# Patient Record
Sex: Male | Born: 2000 | Race: White | Hispanic: No | Smoking: Never smoker
Health system: Southern US, Community
[De-identification: ages and names within clinical notes are randomized; demographics above are authoritative.]

## PROBLEM LIST (undated history)

## (undated) ENCOUNTER — Emergency Department: Admission: EM | Disposition: A | Discharge status: Home / Self Care | Payer: Self-pay | Source: Home / Self Care

## (undated) DIAGNOSIS — J45909 Unspecified asthma, uncomplicated: Secondary | ICD-10-CM

---

## 2019-05-20 ENCOUNTER — Other Ambulatory Visit: Payer: Self-pay

## 2019-05-20 DIAGNOSIS — Z20822 Contact with and (suspected) exposure to covid-19: Secondary | ICD-10-CM

## 2019-05-22 LAB — NOVEL CORONAVIRUS, NAA: SARS-CoV-2, NAA: NOT DETECTED

## 2020-02-28 ENCOUNTER — Encounter: Payer: Self-pay | Admitting: Emergency Medicine

## 2020-02-28 ENCOUNTER — Ambulatory Visit
Admission: EM | Admit: 2020-02-28 | Discharge: 2020-02-28 | Disposition: A | Discharge status: Home / Self Care | Payer: 59 | Attending: Family Medicine | Admitting: Family Medicine

## 2020-02-28 ENCOUNTER — Ambulatory Visit (INDEPENDENT_AMBULATORY_CARE_PROVIDER_SITE_OTHER): Payer: 59

## 2020-02-28 ENCOUNTER — Other Ambulatory Visit: Payer: Self-pay

## 2020-02-28 DIAGNOSIS — Z8709 Personal history of other diseases of the respiratory system: Secondary | ICD-10-CM | POA: Diagnosis present

## 2020-02-28 DIAGNOSIS — R0789 Other chest pain: Secondary | ICD-10-CM | POA: Insufficient documentation

## 2020-02-28 DIAGNOSIS — M940 Chondrocostal junction syndrome [Tietze]: Secondary | ICD-10-CM | POA: Diagnosis not present

## 2020-02-28 HISTORY — DX: Unspecified asthma, uncomplicated: J45.909

## 2020-02-28 LAB — TROPONIN I (HIGH SENSITIVITY): Troponin I (High Sensitivity): 4 ng/L (ref <18)

## 2020-02-28 MED ORDER — ALBUTEROL SULFATE HFA 108 (90 BASE) MCG/ACT IN AERS: 1.0000 | INHALATION_SPRAY | Freq: Four times a day (QID) | RESPIRATORY_TRACT | 0 refills | Status: DC | PRN

## 2020-02-28 NOTE — ED Provider Notes (Signed)
[facility]    CSN: [id number] Arrival date & time: 02/28/20  0818      History   Chief Complaint Chief Complaint  Patient presents with  . Chest Pain    HPI Gabriel Gibson is a 19 y.o. male.   19 yo male with a c/o bilateral lower rib pains and tightness in his chest for the past 5 days (since Monday). Denies any injuries, cough, fevers, chills, shortness of breath, wheezing, rash, swelling or other symptoms. Has not had the covid vaccine. States as a child he had asthma and he does have seasonal allergies.  States that he vapes but denies any other drug use. No family h/o premature cardiac disease.    Chest Pain   Past Medical History:  Diagnosis Date  . Asthma     There are no problems to display for this patient.   History reviewed. No pertinent surgical history.     Home Medications    Prior to Admission medications   Medication Sig Start Date End Date Taking? Authorizing Provider  albuterol (VENTOLIN HFA) 108 (90 Base) MCG/ACT inhaler Inhale 1-2 puffs into the lungs every 6 (six) hours as needed for wheezing or shortness of breath. 02/28/20   Payton Mccallum, MD    Family History Family History  Problem Relation Age of Onset  . Healthy Mother     Social History Social History   Tobacco Use  . Smoking status: Never Smoker  . Smokeless tobacco: Never Used  Vaping Use  . Vaping Use: Every day  Substance Use Topics  . Alcohol use: Yes  . Drug use: Never     Allergies   Patient has no known allergies.   Review of Systems Review of Systems  Cardiovascular: Positive for chest pain.     Physical Exam Triage Vital Signs ED Triage Vitals  Enc Vitals Group     BP 02/28/20 0833 (!) 146/78     Pulse Rate 02/28/20 0833 74     Resp 02/28/20 0833 14     Temp 02/28/20 0833 98.2 F (36.8 C)     Temp Source 02/28/20 0833 Oral     SpO2 02/28/20 0833 100 %     Weight 02/28/20 0831 178 lb (80.7 kg)     Height 02/28/20 0831 5\' 11"  (1.803 m)       Head Circumference --      Peak Flow --      Pain Score 02/28/20 0831 5     Pain Loc --      Pain Edu? --      Excl. in GC? --    No data found.  Updated Vital Signs BP 122/80 (BP Location: Right Arm)   Pulse 74   Temp 98.2 F (36.8 C) (Oral)   Resp 14   Ht 5\' 11"  (1.803 m)   Wt 80.7 kg   SpO2 100%   BMI 24.83 kg/m   Visual Acuity Right Eye Distance:   Left Eye Distance:   Bilateral Distance:    Right Eye Near:   Left Eye Near:    Bilateral Near:     Physical Exam Vitals and nursing note reviewed.  Constitutional:      General: He is not in acute distress.    Appearance: He is not toxic-appearing or diaphoretic.  Cardiovascular:     Rate and Rhythm: Normal rate and regular rhythm.     Heart sounds: Normal heart sounds.  Pulmonary:     Effort: Pulmonary  effort is normal. No respiratory distress.     Breath sounds: Normal breath sounds. No stridor. No wheezing, rhonchi or rales.  Chest:     Chest wall: Tenderness (mild) present.  Neurological:     Mental Status: He is alert.      UC Treatments / Results  Labs (all labs ordered are listed, but only abnormal results are displayed) Labs Reviewed  TROPONIN I (HIGH SENSITIVITY)    EKG   Radiology DG Chest 2 View  Result Date: 02/28/2020 CLINICAL DATA:  Chest pain EXAM: CHEST - 2 VIEW COMPARISON:  None. FINDINGS: Normal heart size. Normal mediastinal contour. No pneumothorax. No pleural effusion. Lungs appear clear, with no acute consolidative airspace disease and no pulmonary edema. IMPRESSION: No active cardiopulmonary disease. Electronically Signed   By: Ilona Sorrel M.D.   On: 02/28/2020 09:23    Procedures ED EKG  Date/Time: 02/28/2020 9:47 AM Performed by: Norval Gable, MD Authorized by: Norval Gable, MD   ECG reviewed by ED Physician in the absence of a cardiologist: yes   Previous ECG:    Previous ECG:  Unavailable Interpretation:    Interpretation: normal   Rate:    ECG rate:   82   ECG rate assessment: normal   Rhythm:    Rhythm: sinus rhythm   Ectopy:    Ectopy: none   QRS:    QRS axis:  Normal Conduction:    Conduction: normal   ST segments:    ST segments:  Normal T waves:    T waves: normal     (including critical care time)  Medications Ordered in UC Medications - No data to display  Initial Impression / Assessment and Plan / UC Course  I have reviewed the triage vital signs and the nursing notes.  Pertinent labs & imaging results that were available during my care of the patient were reviewed by me and considered in my medical decision making (see chart for details).      Final Clinical Impressions(s) / UC Diagnoses   Final diagnoses:  Atypical chest pain  H/O intrinsic asthma  Costochondritis     Discharge Instructions     Tylenol, advil as needed    ED Prescriptions    Medication Sig Dispense Auth. Provider   albuterol (VENTOLIN HFA) 108 (90 Base) MCG/ACT inhaler Inhale 1-2 puffs into the lungs every 6 (six) hours as needed for wheezing or shortness of breath. 8 g Norval Gable, MD      1. Labs/x-ray/ekg results and diagnosis reviewed with patient and parent 2. rx as per orders above; reviewed possible side effects, interactions, risks and benefits  3. Recommend supportive treatment with as above 4. Follow up with PCP 5. Follow-up prn if symptoms worsen or don't improve  PDMP not reviewed this encounter.   Norval Gable, MD 02/28/20 [phone]

## 2020-02-28 NOTE — Discharge Instructions (Addendum)
Tylenol, advil as needed

## 2020-02-28 NOTE — ED Triage Notes (Signed)
Patient c/o bilateral rib pain and tightness in his chest that started on Monday.  Patient denies cough or SOB.  Patient denies any injury or fall.  Patient states that he has not had a COVID vaccine.

## 2020-05-11 ENCOUNTER — Other Ambulatory Visit: Payer: Self-pay

## 2020-05-11 ENCOUNTER — Ambulatory Visit
Admission: EM | Admit: 2020-05-11 | Discharge: 2020-05-11 | Disposition: A | Discharge status: Home / Self Care | Payer: 59 | Attending: Internal Medicine | Admitting: Internal Medicine

## 2020-05-11 DIAGNOSIS — R300 Dysuria: Secondary | ICD-10-CM | POA: Insufficient documentation

## 2020-05-11 DIAGNOSIS — N390 Urinary tract infection, site not specified: Secondary | ICD-10-CM | POA: Insufficient documentation

## 2020-05-11 LAB — CHLAMYDIA/NGC RT PCR (ARMC ONLY)
Chlamydia Tr: NOT DETECTED
N gonorrhoeae: NOT DETECTED

## 2020-05-11 LAB — URINALYSIS, COMPLETE (UACMP) WITH MICROSCOPIC
Bilirubin Urine: NEGATIVE
Glucose, UA: NEGATIVE mg/dL
Hgb urine dipstick: NEGATIVE
Ketones, ur: NEGATIVE mg/dL
Leukocytes,Ua: NEGATIVE
Nitrite: NEGATIVE
Protein, ur: NEGATIVE mg/dL
RBC / HPF: NONE SEEN RBC/hpf (ref 0–5)
Specific Gravity, Urine: 1.01 (ref 1.005–1.030)
pH: 7 (ref 5.0–8.0)

## 2020-05-11 MED ORDER — SULFAMETHOXAZOLE-TRIMETHOPRIM 800-160 MG PO TABS: 1.0000 | ORAL_TABLET | Freq: Two times a day (BID) | ORAL | 0 refills | Status: DC

## 2020-05-11 NOTE — ED Triage Notes (Signed)
Patient in today w/ c/o urinary frequency and burning. Patient states sx onset was approx. 3 wks ago. Patient has not taken anything for this.

## 2020-05-11 NOTE — Discharge Instructions (Addendum)

## 2020-05-11 NOTE — ED Provider Notes (Signed)
[facility]    CSN: [id number] Arrival date & time: 05/11/20  1847      History   Chief Complaint Chief Complaint  Patient presents with  . Urinary Tract Infection    HPI Gabriel Gibson is a 19 y.o. male.   19 y/o male presents for pain at the end of urination x 3 weeks. Admits to urgency of urination. Denies hematuria, fever, fatigue, chills, back pain, n/v/d, urethral discharge, testicular pain. Patient has not been sexually active in 2 years and is not concerned for STIs. Denies skin sores or skin color changes. No other concerns today.     Past Medical History:  Diagnosis Date  . Asthma     There are no problems to display for this patient.   History reviewed. No pertinent surgical history.     Home Medications    Prior to Admission medications   Medication Sig Start Date End Date Taking? Authorizing Provider  albuterol (VENTOLIN HFA) 108 (90 Base) MCG/ACT inhaler Inhale 1-2 puffs into the lungs every 6 (six) hours as needed for wheezing or shortness of breath. 02/28/20  Yes Payton Mccallum, MD  sulfamethoxazole-trimethoprim (BACTRIM DS) 800-160 MG tablet Take 1 tablet by mouth 2 (two) times daily for 7 days. 05/11/20 05/18/20  Shirlee Latch, PA-C    Family History Family History  Problem Relation Age of Onset  . Healthy Mother     Social History Social History   Tobacco Use  . Smoking status: Never Smoker  . Smokeless tobacco: Never Used  Vaping Use  . Vaping Use: Every day  Substance Use Topics  . Alcohol use: Yes  . Drug use: Never     Allergies   Patient has no known allergies.   Review of Systems Review of Systems  Constitutional: Negative for fatigue and fever.  Gastrointestinal: Negative for abdominal pain, nausea and vomiting.  Genitourinary: Positive for difficulty urinating and dysuria. Negative for discharge, frequency, genital sores, hematuria, penile pain and testicular pain.  Musculoskeletal: Negative for back pain.    Skin: Negative for rash.     Physical Exam Triage Vital Signs ED Triage Vitals  Enc Vitals Group     BP 05/11/20 1915 (!) 142/75     Pulse Rate 05/11/20 1915 75     Resp 05/11/20 1915 18     Temp 05/11/20 1915 98.7 F (37.1 C)     Temp Source 05/11/20 1915 Oral     SpO2 05/11/20 1915 100 %     Weight 05/11/20 1917 178 lb (80.7 kg)     Height 05/11/20 1917 5\' 11"  (1.803 m)     Head Circumference --      Peak Flow --      Pain Score 05/11/20 1917 4     Pain Loc --      Pain Edu? --      Excl. in GC? --    No data found.  Updated Vital Signs BP (!) 142/75 (BP Location: Left Arm)   Pulse 75   Temp 98.7 F (37.1 C) (Oral)   Resp 18   Ht 5\' 11"  (1.803 m)   Wt 178 lb (80.7 kg)   SpO2 100%   BMI 24.83 kg/m    Physical Exam Vitals and nursing note reviewed.  Constitutional:      Appearance: He is well-developed.  HENT:     Head: Normocephalic and atraumatic.  Eyes:     General: No scleral icterus.    Conjunctiva/sclera: Conjunctivae  normal.  Cardiovascular:     Rate and Rhythm: Normal rate and regular rhythm.     Heart sounds: Normal heart sounds. No murmur heard.   Pulmonary:     Effort: Pulmonary effort is normal. No respiratory distress.     Breath sounds: Normal breath sounds.  Abdominal:     Palpations: Abdomen is soft.     Tenderness: There is no abdominal tenderness. There is no right CVA tenderness or left CVA tenderness.  Musculoskeletal:     Cervical back: Neck supple.  Skin:    General: Skin is warm and dry.  Neurological:     Mental Status: He is alert.  Psychiatric:        Mood and Affect: Mood normal.        Behavior: Behavior normal.        Thought Content: Thought content normal.      UC Treatments / Results  Labs (all labs ordered are listed, but only abnormal results are displayed) Labs Reviewed  URINALYSIS, COMPLETE (UACMP) WITH MICROSCOPIC - Abnormal; Notable for the following components:      Result Value   Bacteria, UA FEW  (*)    All other components within normal limits  CHLAMYDIA/NGC RT PCR ([facility] ONLY)  URINE CULTURE    EKG   Radiology No results found.  Procedures Procedures (including critical care time)  Medications Ordered in UC Medications - No data to display  Initial Impression / Assessment and Plan / UC Course  I have reviewed the triage vital signs and the nursing notes.  Pertinent labs & imaging results that were available during my care of the patient were reviewed by me and considered in my medical decision making (see chart for details).    Patient with pain at end of urination x 3 weeks has few bacterial on UA. Sending culture and treating with antibiotics for possible UTI at this time. Denies STI concerns since not sexually active but agrees to testing. Deferred genital exam at this time--he denies testicular pain, skin lesions, etc. Advised following up with urologist if negative urine culture/STI testing and still symptomatic.   Final Clinical Impressions(s) / UC Diagnoses   Final diagnoses:  Dysuria  Lower urinary tract infectious disease     Discharge Instructions     UTI: Based on either symptoms or urinalysis, you may have a urinary tract infection. We will send the urine for culture and call with results in a few days. Begin antibiotics at this time. Your symptoms should be much improved over the next 2-3 days. Increase rest and fluid intake. If for some reason symptoms are worsening or not improving after a couple of days or the urine culture determines the antibiotics you are taking will not treat the infection, the antibiotics may be changed. Return or go to ER for fever, back pain, worsening urinary pain, discharge, increased blood in urine. May take Tylenol or Motrin OTC for pain relief or consider AZO if no contraindications     ED Prescriptions    Medication Sig Dispense Auth. Provider   sulfamethoxazole-trimethoprim (BACTRIM DS) 800-160 MG tablet Take 1  tablet by mouth 2 (two) times daily for 7 days. 14 tablet Gareth Morgan     PDMP not reviewed this encounter.   Shirlee Latch, PA-C 05/12/20 1616

## 2020-05-12 LAB — URINE CULTURE: Culture: <10000 — AB

## 2020-05-14 ENCOUNTER — Other Ambulatory Visit: Payer: Self-pay

## 2020-05-14 ENCOUNTER — Encounter: Payer: Self-pay | Admitting: Emergency Medicine

## 2020-05-14 ENCOUNTER — Ambulatory Visit
Admission: EM | Admit: 2020-05-14 | Discharge: 2020-05-14 | Disposition: A | Discharge status: Home / Self Care | Payer: 59 | Attending: Family Medicine | Admitting: Family Medicine

## 2020-05-14 DIAGNOSIS — R300 Dysuria: Secondary | ICD-10-CM | POA: Insufficient documentation

## 2020-05-14 LAB — URINALYSIS, COMPLETE (UACMP) WITH MICROSCOPIC
Bacteria, UA: NONE SEEN
Bilirubin Urine: NEGATIVE
Glucose, UA: NEGATIVE mg/dL
Hgb urine dipstick: NEGATIVE
Ketones, ur: NEGATIVE mg/dL
Leukocytes,Ua: NEGATIVE
Nitrite: NEGATIVE
Protein, ur: NEGATIVE mg/dL
RBC / HPF: NONE SEEN RBC/hpf (ref 0–5)
Specific Gravity, Urine: 1.01 (ref 1.005–1.030)
WBC, UA: NONE SEEN WBC/hpf (ref 0–5)
pH: 7 (ref 5.0–8.0)

## 2020-05-14 NOTE — ED Triage Notes (Signed)
Patient in today c/o 3.5 week history of burning with urination. Patient seen 05/11/20 for same at [facility]. Patient given antibiotics, but then advised to stop taking them because urine culture was negative. Patient had GC/Chlamydia testing which were negative. Patient states that he now has a discoloration on his penis x 2-3 days.

## 2020-05-14 NOTE — Discharge Instructions (Signed)
No evidence of UTI or infection.  I have placed a referral to urology.  Take care   Dr. Adriana Simas

## 2020-05-15 NOTE — ED Provider Notes (Signed)
[facility]    CSN: [id number] Arrival date & time: 05/14/20  1902   History   Chief Complaint Chief Complaint  Patient presents with  . Dysuria  . discoloration on penis   HPI  19 year old male presents with the above complaints.   Patient recently seen on 8/23 for dysuria. STD testing negative as was urine culture.   Patient reports that he continues to have dysuria. Has had dysuria for the past 3.5 weeks. No fever. No abdominal pain. No relieving factors. Is now concerned that he has discoloration of his genitals. No other associated symptoms. No other complaints.  Past Medical History:  Diagnosis Date  . Asthma    Home Medications    Prior to Admission medications   Medication Sig Start Date End Date Taking? Authorizing Provider  albuterol (VENTOLIN HFA) 108 (90 Base) MCG/ACT inhaler Inhale 1-2 puffs into the lungs every 6 (six) hours as needed for wheezing or shortness of breath. 02/28/20  Yes Payton Mccallum, MD    Family History Family History  Problem Relation Age of Onset  . Other Mother        unknown medical history  . Healthy Father     Social History Social History   Tobacco Use  . Smoking status: Never Smoker  . Smokeless tobacco: Never Used  Vaping Use  . Vaping Use: Every day  . Substances: Nicotine, Flavoring  Substance Use Topics  . Alcohol use: Yes    Comment: occassional  . Drug use: Never     Allergies   Patient has no known allergies.   Review of Systems Review of Systems  Constitutional: Negative.   Genitourinary: Positive for dysuria.   Physical Exam Triage Vital Signs ED Triage Vitals [05/14/20 2005]  Enc Vitals Group     BP (!) 148/89     Pulse Rate 81     Resp 18     Temp 98.7 F (37.1 C)     Temp Source Oral     SpO2 100 %     Weight 178 lb (80.7 kg)     Height 5\' 11"  (1.803 m)     Head Circumference      Peak Flow      Pain Score 4     Pain Loc      Pain Edu?      Excl. in GC?    Updated Vital  Signs BP (!) 148/89 (BP Location: Left Arm)   Pulse 81   Temp 98.7 F (37.1 C) (Oral)   Resp 18   Ht 5\' 11"  (1.803 m)   Wt 80.7 kg   SpO2 100%   BMI 24.83 kg/m   Visual Acuity Right Eye Distance:   Left Eye Distance:   Bilateral Distance:    Right Eye Near:   Left Eye Near:    Bilateral Near:     Physical Exam Constitutional:      General: He is not in acute distress.    Appearance: Normal appearance. He is not ill-appearing.  HENT:     Head: Normocephalic and atraumatic.  Pulmonary:     Effort: Pulmonary effort is normal. No respiratory distress.  Genitourinary:    Penis: Normal.      Testes: Normal.  Neurological:     Mental Status: He is alert.  Psychiatric:        Mood and Affect: Mood normal.        Behavior: Behavior normal.     UC  Treatments / Results  Labs (all labs ordered are listed, but only abnormal results are displayed) Labs Reviewed  URINALYSIS, COMPLETE (UACMP) WITH MICROSCOPIC    EKG   Radiology No results found.  Procedures Procedures (including critical care time)  Medications Ordered in UC Medications - No data to display  Initial Impression / Assessment and Plan / UC Course  I have reviewed the triage vital signs and the nursing notes.  Pertinent labs & imaging results that were available during my care of the patient were reviewed by me and considered in my medical decision making (see chart for details).    19 year old male presents with dysuria. Normal urine today. Recent negative culture and negative. STD testing. Normal GU exam. Unclear etiology at this time. Referring to urology.    Final Clinical Impressions(s) / UC Diagnoses   Final diagnoses:  Dysuria     Discharge Instructions     No evidence of UTI or infection.  I have placed a referral to urology.  Take care   Dr. Adriana Simas   ED Prescriptions    None     PDMP not reviewed this encounter.   Tommie Sams, [place] 05/15/20 2224

## 2020-05-27 ENCOUNTER — Ambulatory Visit: Payer: 59 | Admitting: Urology

## 2020-05-27 ENCOUNTER — Encounter: Payer: Self-pay | Admitting: Urology

## 2020-05-27 ENCOUNTER — Other Ambulatory Visit: Payer: Self-pay

## 2020-05-27 VITALS — BP 152/88 | HR 70 | Ht 71.0 in | Wt 178.0 lb

## 2020-05-27 DIAGNOSIS — R300 Dysuria: Secondary | ICD-10-CM | POA: Diagnosis not present

## 2020-05-27 DIAGNOSIS — N5082 Scrotal pain: Secondary | ICD-10-CM | POA: Diagnosis not present

## 2020-05-27 MED ORDER — ALFUZOSIN HCL ER 10 MG PO TB24: 10.0000 mg | ORAL_TABLET | Freq: Every day | ORAL | 0 refills | Status: DC

## 2020-05-27 NOTE — Progress Notes (Signed)
   05/27/2020 9:28 AM   Gabriel Gibson 09/17/2001 [id number]  Referring provider: Tommie Sams, DO [address] [place],  [place] [address]  Chief Complaint  Patient presents with  . Other    HPI: Gabriel Gibson is a 19 y.o. male seen at the request Everlene Other, DO for evaluation of dysuria.   Seen [facility] 05/11/2020 with a 3 week history of pain at the end of urination and urgency  Urinalysis unremarkable and urine culture negative; also underwent testing for chlamydia which was negative  Treated with an empiric course of Septra DS  Returned to Urgent Care 05/14/2020 complaining of persistent symptoms; urinalysis repeated and was clear and he was referred to urology  Also states he had bilateral scrotal pain during that same time period  Dysuria resolved last week and states his scrotal pain is much better  Denies gross hematuria  No prior history urologic problems   PMH: Past Medical History:  Diagnosis Date  . Asthma     Surgical History: History reviewed. No pertinent surgical history.  Home Medications:  Allergies as of 05/27/2020   No Known Allergies     Medication List       Accurate as of May 27, 2020  9:28 AM. If you have any questions, ask your nurse or doctor.        albuterol 108 (90 Base) MCG/ACT inhaler Commonly known as: VENTOLIN HFA Inhale 1-2 puffs into the lungs every 6 (six) hours as needed for wheezing or shortness of breath.       Allergies: No Known Allergies  Family History: Family History  Problem Relation Age of Onset  . Other Mother        unknown medical history  . Healthy Father     Social History:  reports that he has never smoked. He has never used smokeless tobacco. He reports current alcohol use. He reports that he does not use drugs.   Physical Exam: BP (!) 152/88   Pulse 70   Ht 5\' 11"  (1.803 m)   Wt 178 lb (80.7 kg)   BMI 24.83 kg/m   Constitutional:  Alert and oriented, No acute  distress. HEENT: [place] AT, moist mucus membranes.  Trachea midline, no masses. Cardiovascular: No clubbing, cyanosis, or edema. Respiratory: Normal respiratory effort, no increased work of breathing. GI: Abdomen is soft, nontender, nondistended, no abdominal masses GU: Phallus circumcised without lesions, meatus normal in appearance; testes descended bilateral without masses or tenderness; spermatic cord/epididymis palpably normal bilaterally Skin: No rashes, bruises or suspicious lesions. Neurologic: Grossly intact, no focal deficits, moving all 4 extremities. Psychiatric: Normal mood and affect.  Laboratory Data:  Urinalysis Dipstick/microscopy negative  Assessment & Plan:     19 y.o. male with a 4-week history of dysuria and scrotal pain.  Urinalysis, urine culture and testing for chlamydia negative.  Dysuria has resolved and scrotal pain significantly improved.  No significant abnormalities on exam.  Symptoms may be secondary to nonbacterial prostatitis which is improving.  Discussed continue to monitor symptoms versus an alpha-blocker trial and he has elected the latter.  Rx alfuzosin was sent to pharmacy.  He was instructed to call back for worsening or persistent symptoms.   34, MD  [id number] [facility] [address], [address] [place], [place] [phone]

## 2020-05-28 LAB — MICROSCOPIC EXAMINATION: RBC, Urine: NONE SEEN /hpf (ref 0–2)

## 2020-05-28 LAB — URINALYSIS, COMPLETE
Bilirubin, UA: NEGATIVE
Glucose, UA: NEGATIVE
Ketones, UA: NEGATIVE
Leukocytes,UA: NEGATIVE
Nitrite, UA: NEGATIVE
Protein,UA: NEGATIVE
RBC, UA: NEGATIVE
Specific Gravity, UA: 1.025 (ref 1.005–1.030)
Urobilinogen, Ur: 0.2 mg/dL (ref 0.2–1.0)
pH, UA: 6.5 (ref 5.0–7.5)

## 2020-06-28 ENCOUNTER — Other Ambulatory Visit: Payer: Self-pay

## 2020-06-28 ENCOUNTER — Encounter: Payer: Self-pay | Admitting: Emergency Medicine

## 2020-06-28 ENCOUNTER — Emergency Department
Admission: EM | Admit: 2020-06-28 | Discharge: 2020-06-28 | Disposition: A | Discharge status: Home / Self Care | Payer: 59 | Attending: Emergency Medicine | Admitting: Emergency Medicine

## 2020-06-28 ENCOUNTER — Emergency Department: Payer: 59

## 2020-06-28 DIAGNOSIS — W458XXA Other foreign body or object entering through skin, initial encounter: Secondary | ICD-10-CM | POA: Insufficient documentation

## 2020-06-28 DIAGNOSIS — Z23 Encounter for immunization: Secondary | ICD-10-CM | POA: Diagnosis not present

## 2020-06-28 DIAGNOSIS — J45909 Unspecified asthma, uncomplicated: Secondary | ICD-10-CM | POA: Diagnosis not present

## 2020-06-28 DIAGNOSIS — S60452A Superficial foreign body of right middle finger, initial encounter: Secondary | ICD-10-CM | POA: Insufficient documentation

## 2020-06-28 DIAGNOSIS — S6991XA Unspecified injury of right wrist, hand and finger(s), initial encounter: Secondary | ICD-10-CM

## 2020-06-28 MED ORDER — TETANUS-DIPHTH-ACELL PERTUSSIS 5-2.5-18.5 LF-MCG/0.5 IM SUSP
0.5000 mL | Freq: Once | INTRAMUSCULAR | Status: AC
  Administered 2020-06-28: 0.5 mL via INTRAMUSCULAR

## 2020-06-28 MED ORDER — LIDOCAINE-EPINEPHRINE 2 %-1:100000 IJ SOLN
30.0000 mL | Freq: Once | INTRAMUSCULAR | Status: AC
  Administered 2020-06-28: 30 mL via INTRADERMAL

## 2020-06-28 NOTE — ED Triage Notes (Signed)
Pt arrives POV and ambulatory to triage with c/o fish hook stuck in right middle finger. Pt was fishing in a carp tournament and was trying to retrieve his hook and it became lodged in his finger. Pt is in NAD.

## 2020-06-28 NOTE — ED Notes (Signed)
Pt reports while at a night time fishing tournament pt got fishing hook stuck in middle finger of right hand, area appears swollen and bleeding controlled, pt denies meds or prior injury.  Pt unable to recall last TDAP.  CMS intact to distal finger.  Pt reports pain as minimal without movement of the hook.

## 2020-06-28 NOTE — Discharge Instructions (Addendum)
Please take Tylenol and ibuprofen/Advil for your pain.  It is safe to take them together, or to alternate them every few hours.  Take up to 1000mg  of Tylenol at a time, up to 4 times per day.  Do not take more than 4000 mg of Tylenol in 24 hours.  For ibuprofen, take 400-600 mg, 4-5 times per day.  Keep the area clean and dry. Wash with warm soap and water, keep covered if you are outside or active while healing.

## 2020-06-28 NOTE — ED Provider Notes (Signed)
[facility] Emergency Department Provider Note ____________________________________________   First MD Initiated Contact with Patient 06/28/20 [phone]     (approximate)  I have reviewed the triage vital signs and the nursing notes.  HISTORY  Chief Complaint Foreign Body   HPI Gabriel Gibson is a 19 y.o. malewho presents to the ED for evaluation of foreign body.   Chart review indicates no relevant medical hx.  Patient is left-hand dominant.  Patient reports being in a local fishing tournament, fishing at night and fishing for carp.  He reports an accidental fishhook being lodged into his right third finger at about 1 AM this morning, 4 hours prior to my evaluation.  He reports trying to get a fishhook out of a fish his mouth that he just caught, when the fish flopped and patient accidentally stuck his own hand while trying to catch the fish. Patient currently reporting 4/10 intensity pain, up to 9/10 intensity pain when you manipulate the fishhook, that is constant and aching in nature.  He has not taken any medications prior to arrival.  Nonradiating.    Past Medical History:  Diagnosis Date  . Asthma     There are no problems to display for this patient.   History reviewed. No pertinent surgical history.  Prior to Admission medications   Medication Sig Start Date End Date Taking? Authorizing Provider  albuterol (VENTOLIN HFA) 108 (90 Base) MCG/ACT inhaler Inhale 1-2 puffs into the lungs every 6 (six) hours as needed for wheezing or shortness of breath. 02/28/20   Payton Mccallum, MD  alfuzosin (UROXATRAL) 10 MG 24 hr tablet Take 1 tablet (10 mg total) by mouth daily with breakfast. 05/27/20   Stoioff, Verna Czech, MD    Allergies Patient has no known allergies.  Family History  Problem Relation Age of Onset  . Other Mother        unknown medical history  . Healthy Father     Social History Social History   Tobacco Use  . Smoking status: Never  Smoker  . Smokeless tobacco: Never Used  Vaping Use  . Vaping Use: Every day  . Substances: Nicotine, Flavoring  Substance Use Topics  . Alcohol use: Yes    Comment: occassional  . Drug use: Never    Review of Systems  Constitutional: No fever/chills Eyes: No visual changes. ENT: No sore throat. Cardiovascular: Denies chest pain. Respiratory: Denies shortness of breath. Gastrointestinal: No abdominal pain.  No nausea, no vomiting.  No diarrhea.  No constipation. Genitourinary: Negative for dysuria. Musculoskeletal: Negative for back pain.  Positive for right third finger pain. Skin: Negative for rash. Neurological: Negative for headaches, focal weakness or numbness.  ____________________________________________   PHYSICAL EXAM:  VITAL SIGNS: Vitals:   06/28/20 0232 06/28/20 0720  BP: (!) 155/103 (!) 148/87  Pulse: 72 66  Resp: 18 16  Temp: 97.7 F (36.5 C)   SpO2: 99% 99%      Constitutional: Alert and oriented. Well appearing and in no acute distress. Eyes: Conjunctivae are normal. PERRL. EOMI. Head: Atraumatic. Nose: No congestion/rhinnorhea. Mouth/Throat: Mucous membranes are moist.  Oropharynx non-erythematous. Neck: No stridor. No cervical spine tenderness to palpation. Cardiovascular: Normal rate, regular rhythm. Grossly normal heart sounds.  Good peripheral circulation. Respiratory: Normal respiratory effort.  No retractions. Lungs CTAB. Gastrointestinal: Soft , nondistended, nontender to palpation. No abdominal bruits. No CVA tenderness. Musculoskeletal: No lower extremity tenderness nor edema.  No joint effusions. No signs of acute trauma. Fishhook lodged to the  dorsum of his right third finger, adjacent to PIP joint.  No active bleeding, no purulence.  Nontender right hand and fingers without evidence of bony step-offs, laceration or injury beyond the fishhook Neurologic:  Normal speech and language. No gross focal neurologic deficits are appreciated. No  gait instability noted. Skin:  Skin is warm, dry and intact. No rash noted. Psychiatric: Mood and affect are normal. Speech and behavior are normal.  ____________________________________________  RADIOLOGY  ED MD interpretation: CXR reviewed with single barbed fishhook lodged to the dorsum of his right third finger  Official radiology report(s): DG Hand Complete Right  Result Date: 06/28/2020 CLINICAL DATA:  Foreign body EXAM: RIGHT HAND - COMPLETE 3+ VIEW COMPARISON:  None. FINDINGS: There is a metallic foreign body in the dorsal aspect of the third digit. This foreign body does not appear to penetrate the underlying proximal phalanx. There is no acute displaced fracture or dislocation. There is mild surrounding soft tissue swelling. IMPRESSION: Foreign body as detailed above. Electronically Signed   By: Katherine Mantle M.D.   On: 06/28/2020 03:57    ____________________________________________   PROCEDURES and INTERVENTIONS  Procedure(s) performed (including Critical Care):  .Nerve Block  Date/Time: 06/28/2020 7:51 AM Performed by: Delton Prairie, MD Authorized by: Delton Prairie, MD   Consent:    Consent obtained:  Verbal   Consent given by:  Patient   Risks discussed:  Allergic reaction, pain, unsuccessful block, bleeding and infection   Alternatives discussed:  No treatment Indications:    Indications:  Procedural anesthesia (Fishhook removal) Location:    Body area:  Upper extremity   Upper extremity nerve blocked: Right third finger digital block.   Laterality:  Right Pre-procedure details:    Skin preparation:  Alcohol Skin anesthesia (see MAR for exact dosages):    Skin anesthesia method:  Local infiltration   Local anesthetic:  Lidocaine 2% WITH epi Procedure details (see MAR for exact dosages):    Block needle gauge:  25 G   Guidance comment:  Landmark   Injection procedure:  Anatomic landmarks identified, incremental injection, negative aspiration for  blood, anatomic landmarks palpated and introduced needle   Paresthesia:  None Post-procedure details:    Dressing:  None   Outcome:  Anesthesia achieved   Patient tolerance of procedure:  Tolerated well, no immediate complications .Foreign Body Removal  Date/Time: 06/28/2020 7:52 AM Performed by: Delton Prairie, MD Authorized by: Delton Prairie, MD  Body area: skin General location: upper extremity Location details: right long finger Anesthesia: digital block Localization method: serial x-rays Removal mechanism: Pliers and wire cutters. Tendon involvement: none Depth: subcutaneous 1 objects recovered. Objects recovered: Fishhook, intact Post-procedure assessment: foreign body removed Patient tolerance: patient tolerated the procedure well with no immediate complications Comments: After digital block achieves digital anesthesia, fishhook grasped with hand-held pliers, advanced until the barb protruded through the skin.  Advanced until the barb is fully out of the skin, and this was assisted with a small nick with an 11 blade scalpel.  Barb of hook was then removed with wire cutters and remainder of the hook was smoothly removed without complication.  Tolerated well.    Medications  Tdap (BOOSTRIX) injection 0.5 mL (0.5 mLs Intramuscular Given 06/28/20 0559)  lidocaine-EPINEPHrine (XYLOCAINE W/EPI) 2 %-1:100000 (with pres) injection 30 mL (30 mLs Intradermal Given 06/28/20 0557)    ____________________________________________   MDM / ED COURSE  Otherwise healthy 19 year old male presents to the ED with a fishhook stuck in his finger amenable to bedside removal.  Normal vitals.  Patient looks well.  No signs of pathology beyond the fishhook to his right third finger.  X-ray shows no bony injury and the fishhook appears superficial to his proximal phalanx of the third finger.  Digital block achieves good anesthesia, and then fishhook was advanced until barb protrudes separately.  This  was removed with wire cutters and the fishhook was easily and smoothly removed thereafter.  Removed intact pieces.  Full active range of motion to his hand and fingers.  We discussed outpatient wound care.  We discussed return precautions for the ED.  Patient medically stable for discharge home.  Clinical Course as of Jun 28 753  Sun Jun 28, 2020  3149 Digital block complete   [DS]    Clinical Course User Index [DS] Delton Prairie, MD     ____________________________________________   FINAL CLINICAL IMPRESSION(S) / ED DIAGNOSES  Final diagnoses:  Fish hook injury of finger of right hand, initial encounter     ED Discharge Orders    None       Betina Puckett Katrinka Blazing   Note:  This document was prepared using Dragon voice recognition software and may include unintentional dictation errors.   Delton Prairie, MD 06/28/20 [phone]

## 2020-09-22 ENCOUNTER — Other Ambulatory Visit: Payer: Self-pay

## 2020-09-22 ENCOUNTER — Other Ambulatory Visit: Payer: 59

## 2020-09-22 DIAGNOSIS — Z20822 Contact with and (suspected) exposure to covid-19: Secondary | ICD-10-CM

## 2020-09-28 LAB — NOVEL CORONAVIRUS, NAA: SARS-CoV-2, NAA: NOT DETECTED

## 2020-09-28 LAB — SARS-COV-2, NAA 2 DAY TAT

## 2021-06-25 IMAGING — CR DG CHEST 2V
3 series · 3 of 3 positions shown · non-contrast
Comparison: None.

CLINICAL DATA: Chest pain

EXAM:
CHEST - 2 VIEW

[chest pa (1 of 2)]
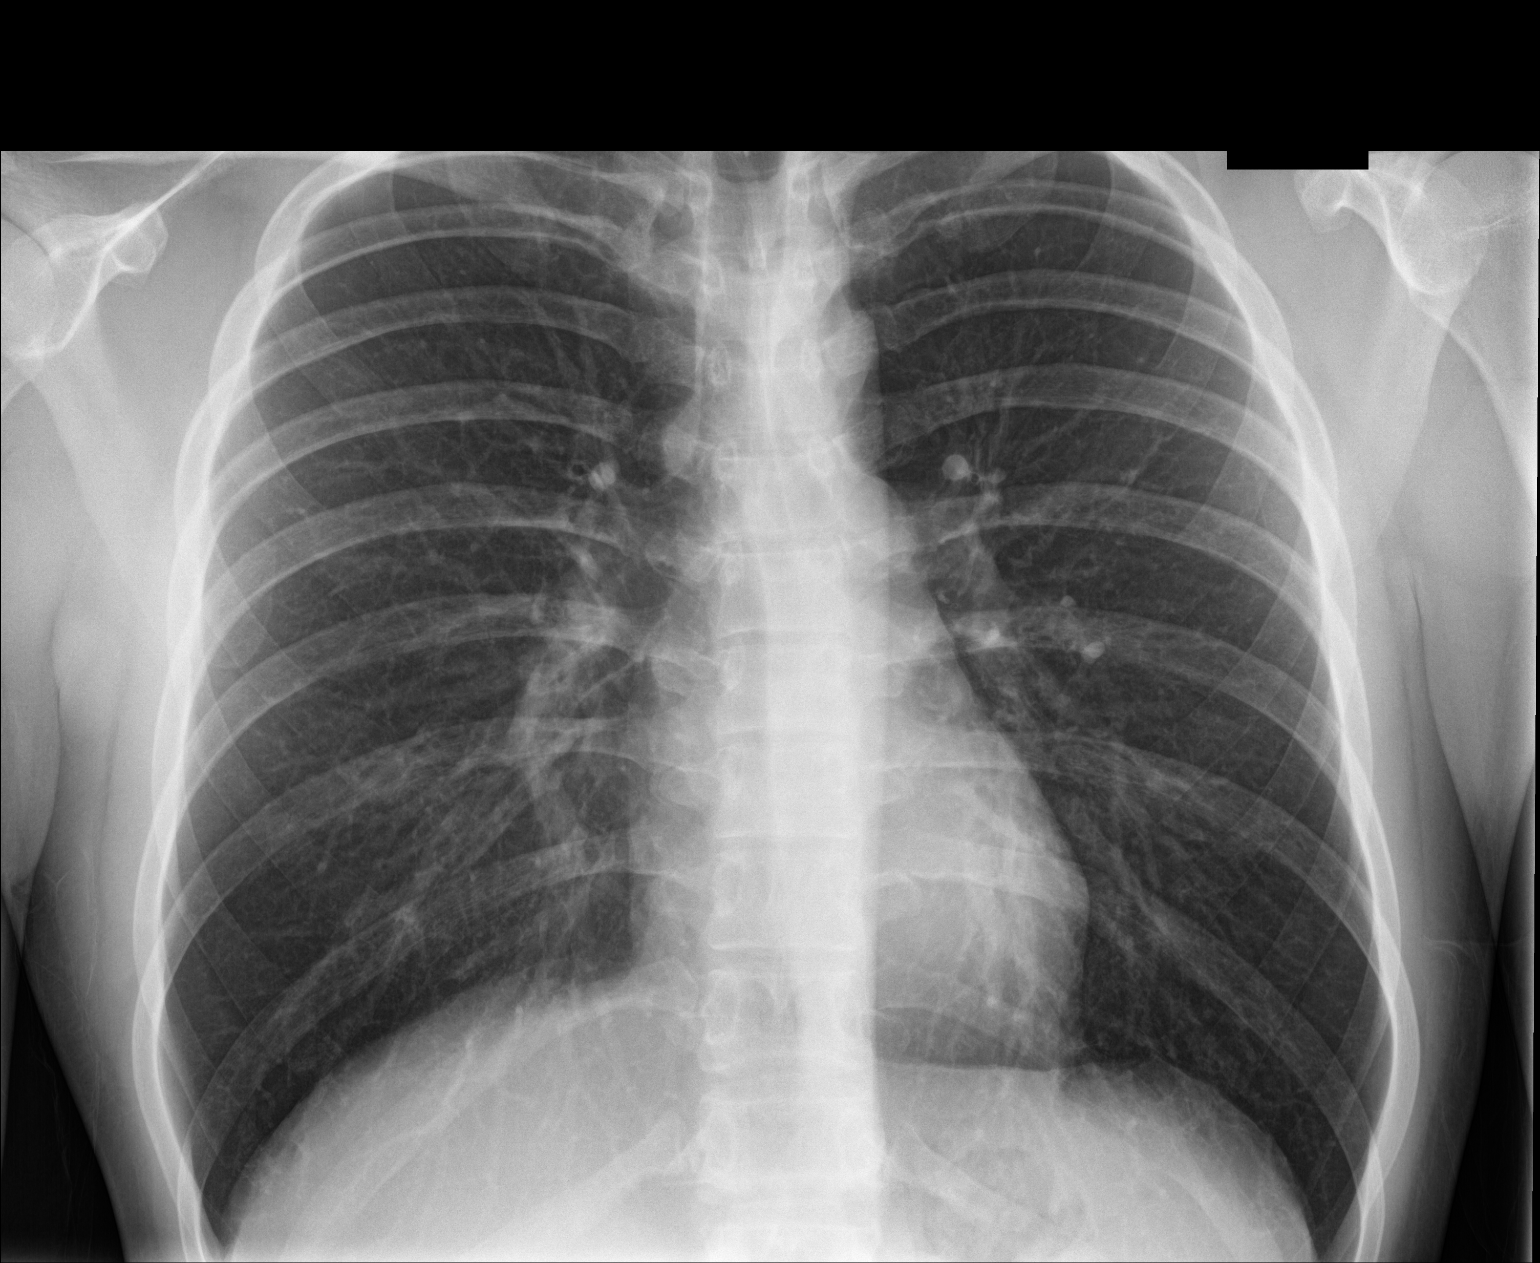

[chest lat]
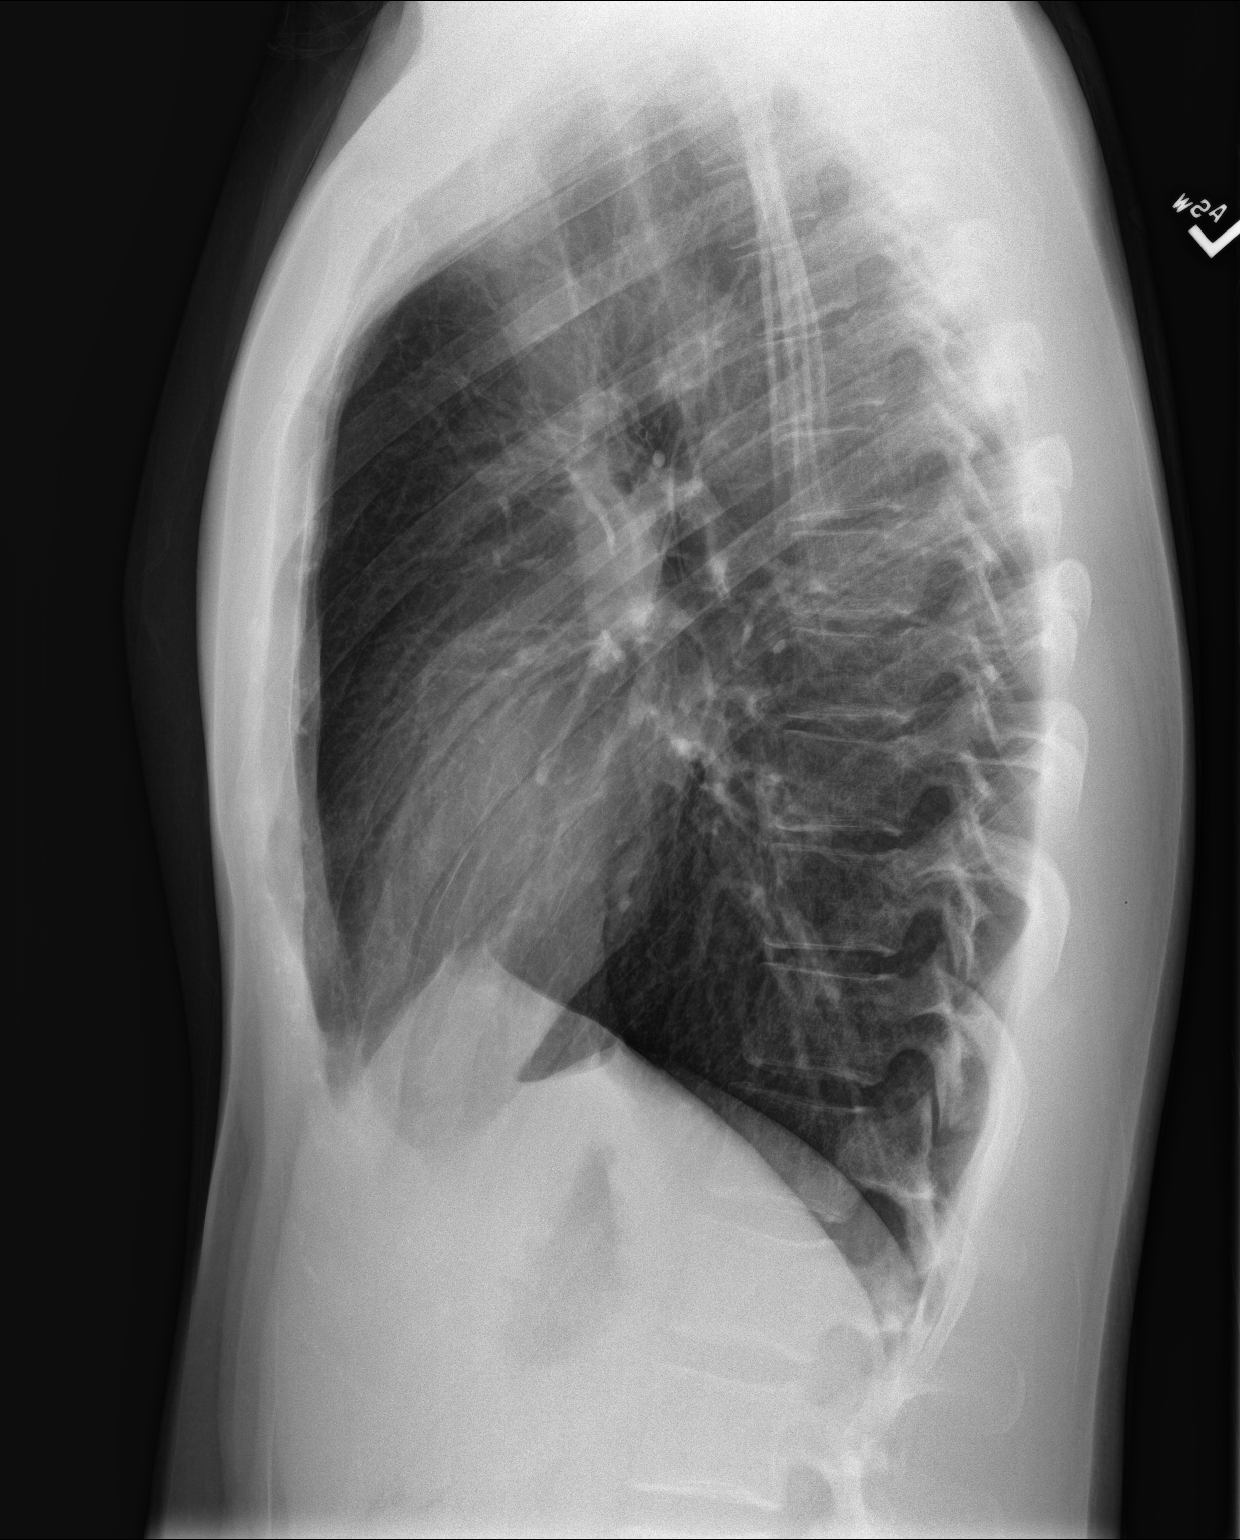

[chest pa (2 of 2)]
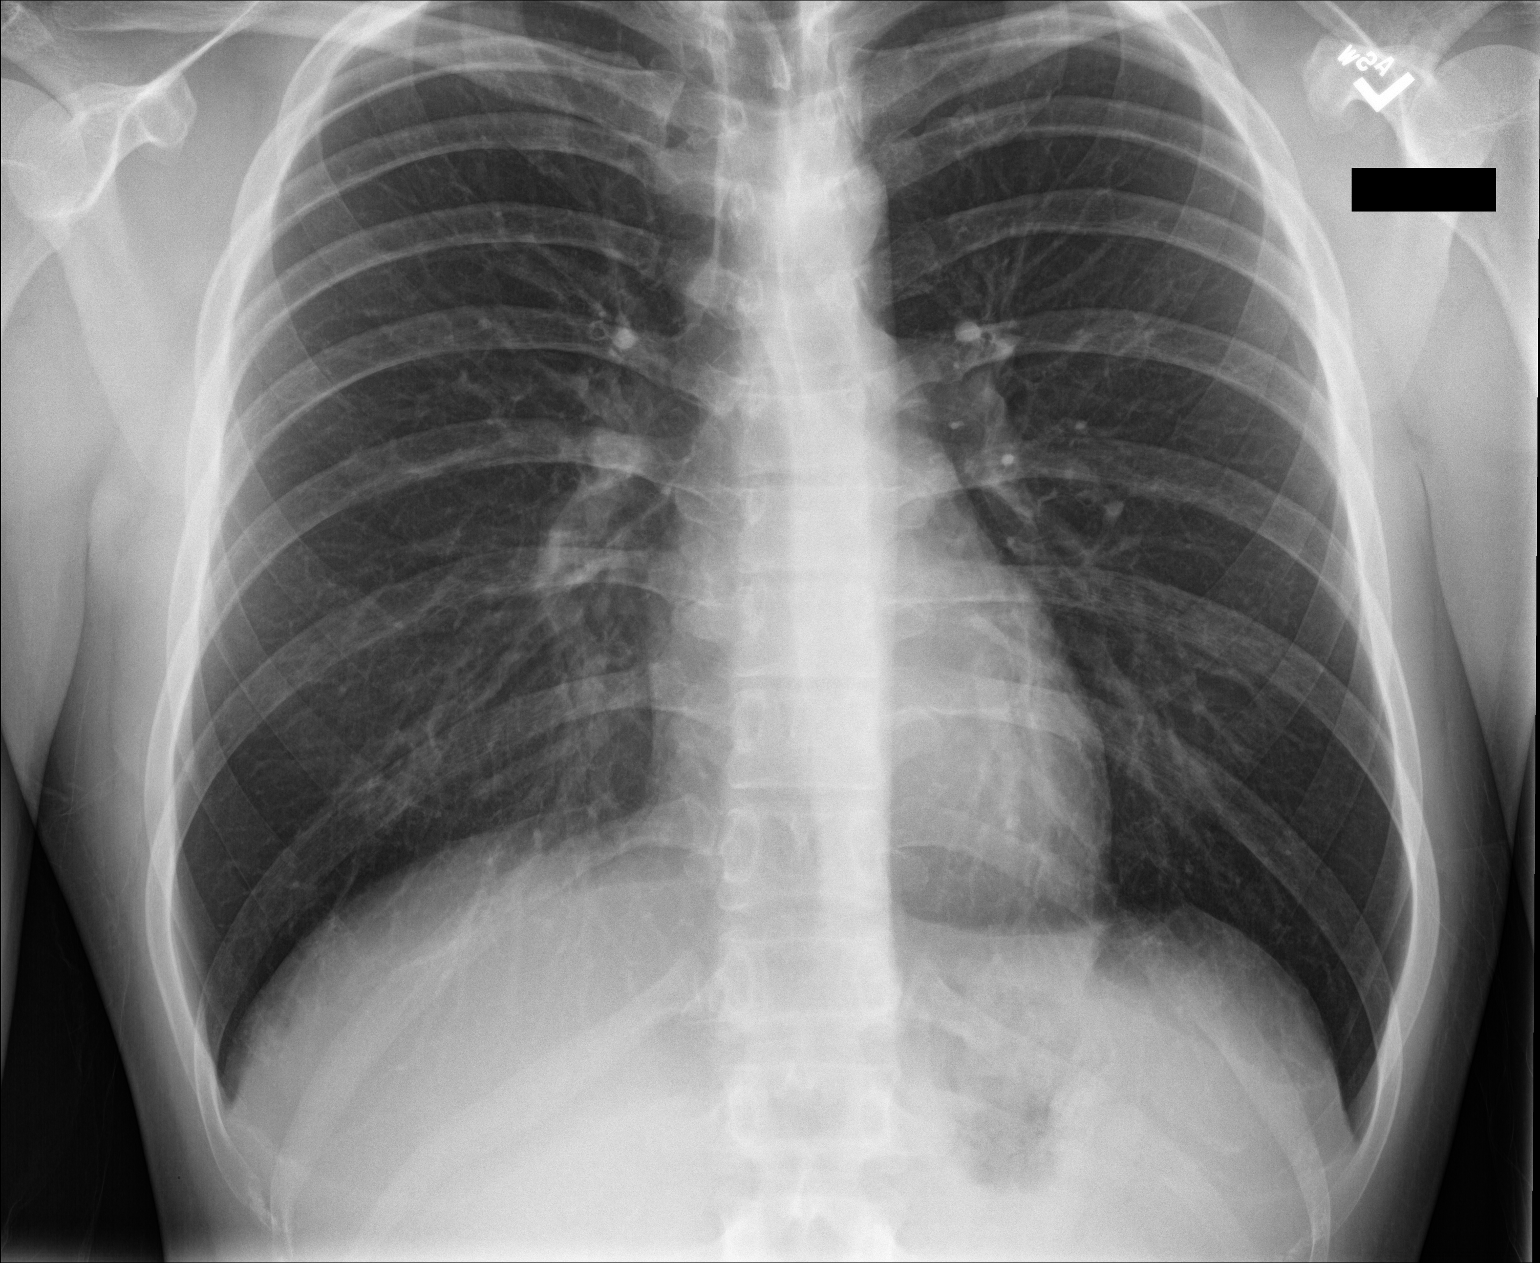

[3 of 3 positions shown; findings below may reference images not displayed]

FINDINGS: Normal heart size. Normal mediastinal contour. No pneumothorax. No
pleural effusion. Lungs appear clear, with no acute consolidative
airspace disease and no pulmonary edema.
IMPRESSION: No active cardiopulmonary disease.

## 2021-08-11 ENCOUNTER — Encounter: Payer: Self-pay | Admitting: Emergency Medicine

## 2021-08-11 ENCOUNTER — Emergency Department (HOSPITAL_COMMUNITY)
Admission: EM | Admit: 2021-08-11 | Discharge: 2021-08-12 | Disposition: A | Discharge status: Home / Self Care | Payer: 59 | Attending: Emergency Medicine | Admitting: Emergency Medicine

## 2021-08-11 ENCOUNTER — Ambulatory Visit
Admission: EM | Admit: 2021-08-11 | Discharge: 2021-08-11 | Disposition: A | Discharge status: Other Acute Inpatient Hospital | Payer: 59

## 2021-08-11 ENCOUNTER — Other Ambulatory Visit: Payer: Self-pay

## 2021-08-11 DIAGNOSIS — J45909 Unspecified asthma, uncomplicated: Secondary | ICD-10-CM | POA: Diagnosis not present

## 2021-08-11 DIAGNOSIS — M79606 Pain in leg, unspecified: Secondary | ICD-10-CM | POA: Insufficient documentation

## 2021-08-11 DIAGNOSIS — R292 Abnormal reflex: Secondary | ICD-10-CM | POA: Insufficient documentation

## 2021-08-11 DIAGNOSIS — R253 Fasciculation: Secondary | ICD-10-CM

## 2021-08-11 DIAGNOSIS — R531 Weakness: Secondary | ICD-10-CM | POA: Diagnosis not present

## 2021-08-11 DIAGNOSIS — M6281 Muscle weakness (generalized): Secondary | ICD-10-CM

## 2021-08-11 LAB — URINALYSIS, ROUTINE W REFLEX MICROSCOPIC
Bilirubin Urine: NEGATIVE
Glucose, UA: NEGATIVE mg/dL
Hgb urine dipstick: NEGATIVE
Ketones, ur: NEGATIVE mg/dL
Leukocytes,Ua: NEGATIVE
Nitrite: NEGATIVE
Protein, ur: NEGATIVE mg/dL
Specific Gravity, Urine: <1.005 — ABNORMAL LOW (ref 1.005–1.030)
pH: 6.5 (ref 5.0–8.0)

## 2021-08-11 LAB — BASIC METABOLIC PANEL
Anion gap: 11 (ref 5–15)
BUN: 12 mg/dL (ref 6–20)
CO2: 25 mmol/L (ref 22–32)
Calcium: 9.8 mg/dL (ref 8.9–10.3)
Chloride: 101 mmol/L (ref 98–111)
Creatinine, Ser: 1.02 mg/dL (ref 0.61–1.24)
GFR, Estimated: >60 mL/min (ref >60)
Glucose, Bld: 104 mg/dL — ABNORMAL HIGH (ref 70–99)
Potassium: 3.7 mmol/L (ref 3.5–5.1)
Sodium: 137 mmol/L (ref 135–145)

## 2021-08-11 LAB — CBC
HCT: 50.6 % (ref 39.0–52.0)
Hemoglobin: 17.6 g/dL — ABNORMAL HIGH (ref 13.0–17.0)
MCH: 31.4 pg (ref 26.0–34.0)
MCHC: 34.8 g/dL (ref 30.0–36.0)
MCV: 90.4 fL (ref 80.0–100.0)
Platelets: 296 10*3/uL (ref 150–400)
RBC: 5.6 MIL/uL (ref 4.22–5.81)
RDW: 12.6 % (ref 11.5–15.5)
WBC: 7.8 10*3/uL (ref 4.0–10.5)
nRBC: 0 % (ref 0.0–0.2)

## 2021-08-11 LAB — CK: Total CK: 84 U/L (ref 49–397)

## 2021-08-11 NOTE — ED Provider Notes (Signed)
[facility] Emergency Department Provider Note MRN:  [id number]  Arrival date & time: 08/11/21     Chief Complaint   Leg Pain   History of Present Illness   Gabriel Gibson is a 20 y.o. year-old male with no pertinent past medical history presenting to the ED with chief complaint of leg pain.  About 10 days of pain to the proximal arms and legs, a few days later began experiencing weakness to the arms and legs as well as fasciculations or tremulousness.  Starting to have some issues walking.  No recent trauma, no fever, no drug use, no headache or vision change, no neck or back pain, no chest pain or shortness of breath, no abdominal pain.  No numbness to the arms or legs, no recent viral illness or diarrhea.  Symptoms constant, mild to moderate, progressing.  Review of Systems  A complete 10 system review of systems was obtained and all systems are negative except as noted in the HPI and PMH.   Patient's Health History    Past Medical History:  Diagnosis Date   Asthma     No past surgical history on file.  Family History  Problem Relation Age of Onset   Other Mother        unknown medical history   Healthy Father     Social History   Socioeconomic History   Marital status: Single    Spouse name: Not on file   Number of children: Not on file   Years of education: Not on file   Highest education level: Not on file  Occupational History   Not on file  Tobacco Use   Smoking status: Never   Smokeless tobacco: Never  Vaping Use   Vaping Use: Every day   Substances: Nicotine, Flavoring  Substance and Sexual Activity   Alcohol use: Yes    Comment: occassional   Drug use: Never   Sexual activity: Not on file  Other Topics Concern   Not on file  Social History Narrative   Not on file   Social Determinants of Health   Financial Resource Strain: Not on file  Food Insecurity: Not on file  Transportation Needs: Not on file  Physical Activity: Not  on file  Stress: Not on file  Social Connections: Not on file  Intimate Partner Violence: Not on file     Physical Exam   Vitals:   08/11/21 2030 08/11/21 2130  BP: 124/76 122/64  Pulse: 64 63  Resp: 17 18  Temp:    SpO2: 98% 98%    CONSTITUTIONAL: Well-appearing, NAD NEURO:  Alert and oriented x 3, normal and symmetric strength and sensation, normal coordination, normal speech, hyperreflexic with hypertonia. EYES:  eyes equal and reactive ENT/NECK:  no LAD, no JVD CARDIO: Regular rate, well-perfused, normal S1 and S2 PULM:  CTAB no wheezing or rhonchi GI/GU:  normal bowel sounds, non-distended, non-tender MSK/SPINE:  No gross deformities, no edema SKIN:  no rash, atraumatic PSYCH:  Appropriate speech and behavior  *Additional and/or pertinent findings included in MDM below  Diagnostic and Interventional Summary    EKG Interpretation  Date/Time:    Ventricular Rate:    PR Interval:    QRS Duration:   QT Interval:    QTC Calculation:   R Axis:     Text Interpretation:         Labs Reviewed  BASIC METABOLIC PANEL - Abnormal; Notable for the following components:  Result Value   Glucose, Bld 104 (*)    All other components within normal limits  CBC - Abnormal; Notable for the following components:   Hemoglobin 17.6 (*)    All other components within normal limits  URINALYSIS, ROUTINE W REFLEX MICROSCOPIC - Abnormal; Notable for the following components:   Specific Gravity, Urine <1.005 (*)    All other components within normal limits  CK    MR Brain W and Wo Contrast    (Results Pending)  MR Cervical Spine W or Wo Contrast    (Results Pending)  MR THORACIC SPINE W CONTRAST    (Results Pending)  MR LUMBAR SPINE W CONTRAST    (Results Pending)    Medications - No data to display   Procedures  /  Critical Care Procedures  ED Course and Medical Decision Making  I have reviewed the triage vital signs, the nursing notes, and pertinent available records  from the EMR.  Listed above are laboratory and imaging tests that I personally ordered, reviewed, and interpreted and then considered in my medical decision making (see below for details).  Differential diagnosis includes demyelinating disease, upper motor neuron lesion.  CT imaging considered but felt to be low utility at this time, will transfer to [facility] for MRI imaging.  Dr. Manus Gunning is the accepting ED physician.       Elmer Sow. Pilar Plate, MD [facility] Emergency Medicine [facility] [email or web address]  Final Clinical Impressions(s) / ED Diagnoses     ICD-10-CM   1. Weakness  R53.1     2. Hyperreflexia  R29.2       ED Discharge Orders     None        Discharge Instructions Discussed with and Provided to Patient:    Discharge Instructions      We are transferring you to [facility] emergency department so that you can undergo MRI imaging this evening.  Please go straight there.       Sabas Sous, MD 08/11/21 [phone]

## 2021-08-11 NOTE — ED Provider Notes (Addendum)
[facility]    CSN: [id number] Arrival date & time: 08/11/21  1549      History   Chief Complaint Chief Complaint  Patient presents with   Generalized Body Aches   Weakness    HPI Gabriel Gibson is a 20 y.o. male who present with anterior thigh muscle pain which he woke up with one week ago, and denies over use or injury.Since then gradually developed muscle twitching and weakness of lower legs and upper extremities, but denies pain in them. Has noticed twitching of his lateral thighs, and on occasion on lateral thoracic back bilaterally. Denies doing a lot of sweating with working out or physical labor work.  Denies new abdominal pain, N/V/D/C.  No URI or use of any meds or stopping meds.  Denies urine color changes or brown in color.      Past Medical History:  Diagnosis Date   Asthma     There are no problems to display for this patient.   History reviewed. No pertinent surgical history.     Home Medications    Prior to Admission medications   Medication Sig Start Date End Date Taking? Authorizing Provider  hyoscyamine (LEVSIN) 0.125 MG tablet Take by mouth. 03/14/19  Yes [provider]  albuterol (VENTOLIN HFA) 108 (90 Base) MCG/ACT inhaler Inhale 1-2 puffs into the lungs every 6 (six) hours as needed for wheezing or shortness of breath. 02/28/20   Norval Gable, MD  alfuzosin (UROXATRAL) 10 MG 24 hr tablet Take 1 tablet (10 mg total) by mouth daily with breakfast. 05/27/20   Stoioff, Ronda Fairly, MD    Family History Family History  Problem Relation Age of Onset   Other Mother        unknown medical history   Healthy Father     Social History Social History   Tobacco Use   Smoking status: Never   Smokeless tobacco: Never  Vaping Use   Vaping Use: Every day   Substances: Nicotine, Flavoring  Substance Use Topics   Alcohol use: Yes    Comment: occassional   Drug use: Never     Allergies   Patient has no known  allergies.   Review of Systems Review of Systems  Constitutional:  Positive for diaphoresis and fatigue. Negative for activity change, appetite change, chills, fever and unexpected weight change.  HENT:  Negative for congestion, postnasal drip, rhinorrhea and sore throat.   Eyes:  Negative for discharge and visual disturbance.  Respiratory:  Negative for cough.   Cardiovascular:  Negative for chest pain.  Gastrointestinal:  Negative for abdominal pain.  Endocrine: Negative for cold intolerance.  Genitourinary:  Negative for difficulty urinating.  Musculoskeletal:  Positive for myalgias. Negative for back pain, gait problem, joint swelling, neck pain and neck stiffness.  Skin:  Negative for color change, rash and wound.  Neurological:  Positive for weakness. Negative for dizziness, tremors, syncope, light-headedness, numbness and headaches.  Hematological:  Negative for adenopathy.  Psychiatric/Behavioral:  Positive for sleep disturbance.     Physical Exam Triage Vital Signs ED Triage Vitals  Enc Vitals Group     BP 08/11/21 1601 (!) 155/86     Pulse Rate 08/11/21 1601 89     Resp 08/11/21 1601 18     Temp 08/11/21 1601 98 F (36.7 C)     Temp src --      SpO2 08/11/21 1601 99 %     Weight --      Height --  Head Circumference --      Peak Flow --      Pain Score 08/11/21 1553 5     Pain Loc --      Pain Edu? --      Excl. in GC? --    No data found.  Updated Vital Signs BP (!) 155/86   Pulse 89   Temp 98 F (36.7 C)   Resp 18   SpO2 99%   Visual Acuity Right Eye Distance:   Left Eye Distance:   Bilateral Distance:    Right Eye Near:   Left Eye Near:    Bilateral Near:     Physical Exam Physical Exam Vitals signs and nursing note reviewed.  Constitutional:      General: He is not in acute distress.    Appearance: He is well-developed and normal weight. He is not ill-appearing, toxic-appearing or diaphoretic.  HENT:     Head: Normocephalic.  Eyes:      Extraocular Movements: Extraocular movements intact.     Pupils: Pupils are equal, round, and reactive to light.  Neck:     Musculoskeletal: Neck supple. No neck rigidity.     Cardiovascular:     Rate and Rhythm: Normal rate and regular rhythm.     Heart sounds: No murmur.  Pulmonary:     Effort: Pulmonary effort is normal.     Breath sounds: Normal breath sounds. No wheezing, rhonchi or rales.  .  Musculoskeletal: Normal range of motion. Has tremors with any strength muscle testing. Worse on lower extremities. I could not see muscle twitches when he was feeling it on his calf's, but I could see his tremor on his L leg at one point while just standing still.  Lymphadenopathy:     Cervical: No cervical adenopathy.  Skin:    General: Skin is warm and dry.  Neurological:     Mental Status: He is alert.     Cranial Nerves: No cranial nerve deficit or facial asymmetry.     Sensory: No sensory deficit.     Motor: No weakness.     Coordination: Romberg sign negative. Coordination normal.     Gait: Gait normal.     Deep Tendon Reflexes: Reflexes- hyperreflexia    Comments: Normal Romberg, finger to nose, tandem gait.  He was unable to walk on his heels since he felt he was too weak, but tip toe gait was normal.  Psychiatric:        Mood and Affect: Mood normal.        Speech: Speech normal.        Behavior: Behavior normal.    UC Treatments / Results  Labs (all labs ordered are listed, but only abnormal results are displayed) Labs Reviewed - No data to display  EKG   Radiology No results found.  Procedures Procedures (including critical care time)  Medications Ordered in UC Medications - No data to display  Initial Impression / Assessment and Plan / UC Course  I have reviewed the triage vital signs and the nursing notes. Pt was sent to ER for further workup    Final Clinical Impressions(s) / UC Diagnoses   Final diagnoses:  Muscle weakness  Muscle twitching      Discharge Instructions      You need to have more tests than what we can do here including and possibly cat scan. Please go to your closes Emergency Department to have blood work done  ED Prescriptions   None    PDMP not reviewed this encounter.   Shelby Mattocks, PA-C 08/11/21 1644    Rodriguez-Southworth, [place], PA-C 08/11/21 2101

## 2021-08-11 NOTE — ED Triage Notes (Signed)
Pt here with body aches and weakness in legs x 1 week.

## 2021-08-11 NOTE — Discharge Instructions (Addendum)
Follow-up with neurology as recommended. Return to the emergency room if any new, worsening, concerning symptoms.

## 2021-08-11 NOTE — Discharge Instructions (Addendum)
You need to have more tests than what we can do here including and possibly cat scan. Please go to your closes Emergency Department to have blood work done

## 2021-08-11 NOTE — ED Triage Notes (Signed)
Presents for leg pain starting Monday, now progressed to muscle cramps and shaking. Was told to come to ER by UC for "abnormal walk test". Denies heavy exertion. Denies urinary symptoms.Denies previous instances. No daily meds. Denies recreational or etoH use.

## 2021-08-12 ENCOUNTER — Emergency Department (HOSPITAL_COMMUNITY): Payer: 59

## 2021-08-12 DIAGNOSIS — R531 Weakness: Secondary | ICD-10-CM

## 2021-08-12 MED ORDER — GADOBUTROL 1 MMOL/ML IV SOLN
8.0000 mL | Freq: Once | INTRAVENOUS | Status: AC | PRN
  Administered 2021-08-12: 8 mL via INTRAVENOUS

## 2021-08-12 MED ORDER — LORAZEPAM 2 MG/ML IJ SOLN: 1.0000 mg | Freq: Once | INTRAMUSCULAR | Status: DC | PRN

## 2021-08-12 NOTE — ED Notes (Signed)
Pt transported to MRI 

## 2021-08-12 NOTE — ED Notes (Signed)
Neuro at bedside.

## 2021-08-12 NOTE — Consult Note (Signed)
Neurology Consultation  Reason for Consult: Leg weakness Referring Physician: L. Baird Cancer, PA  CC: Extremity weakness and some bilateral anterior thigh aching  History is obtained from: Patient, Chart review  HPI: Gabriel Gibson is a 20 y.o. male with no significant medical history who presented to the [facility] from urgent care for evaluation of 10 days of anterior thigh muscle aches with muscle twitching and easy fatigability of extremities throughout. He denies any recent fevers, nausea, vomiting, or illness.  ROS: A complete ROS was performed and is negative except as noted in the HPI.   Past Medical History:  Diagnosis Date   Asthma    No past surgical history on file.  Family History  Problem Relation Age of Onset   Other Mother        unknown medical history   Healthy Father    Social History:   reports that he has never smoked. He has never used smokeless tobacco. He reports current alcohol use. He reports that he does not use drugs.  Medications  Current Facility-Administered Medications:    LORazepam (ATIVAN) injection 1 mg, 1 mg, Intravenous, Once PRN, Larene Pickett, PA-C  Current Outpatient Medications:    ibuprofen (ADVIL) 200 MG tablet, Take 800 mg by mouth every 6 (six) hours as needed for headache or moderate pain., Disp: , Rfl:    albuterol (VENTOLIN HFA) 108 (90 Base) MCG/ACT inhaler, Inhale 1-2 puffs into the lungs every 6 (six) hours as needed for wheezing or shortness of breath. (Patient not taking: Reported on 08/12/2021), Disp: 8 g, Rfl: 0   alfuzosin (UROXATRAL) 10 MG 24 hr tablet, Take 1 tablet (10 mg total) by mouth daily with breakfast. (Patient not taking: Reported on 08/12/2021), Disp: 30 tablet, Rfl: 0  Exam: Current vital signs: BP 119/75 (BP Location: Right Arm)   Pulse 68   Temp 98.5 F (36.9 C) (Oral)   Resp 18   Ht 5\' 11"  (1.803 m)   Wt 80.7 kg   SpO2 95%   BMI 24.83 kg/m  Vital signs in last 24 hours: Temp:  [98 F (36.7 C)-98.7 F  (37.1 C)] 98.5 F (36.9 C) (11/24 0015) Pulse Rate:  [63-98] 68 (11/24 0629) Resp:  [17-18] 18 (11/24 0629) BP: (118-156)/(64-91) 119/75 (11/24 0629) SpO2:  [95 %-100 %] 95 % (11/24 0629) Weight:  [80.7 kg] 80.7 kg (11/23 1904)  GENERAL: Awake, alert, in no acute distress Psych: Affect appropriate for situation, patient is calm and cooperative with examination Head: Normocephalic and atraumatic, without obvious abnormality EENT: Normal conjunctivae, dry mucous membranes, no OP obstruction LUNGS: Normal respiratory effort. Non-labored breathing on room air CV: Regular rate and rhythm on telemetry ABDOMEN: Soft, non-tender, non-distended Extremities: warm, well perfused, without obvious deformity  NEURO:  Mental Status: Awake, alert, and oriented to person, place, time, and situation. He is able to provide a clear and coherent history of present illness. Speech/Language: speech is intact without dysarthria.   Naming, repetition, fluency, and comprehension intact without aphasia. No neglect is noted Cranial Nerves:  II: PERRL 4 mm/brisk. Visual fields full.  III, IV, VI: EOMI without ptosis, diplopia, or gaze preference.  V: Sensation is intact to light touch and symmetrical to face. VII: Face is symmetric resting and smiling.  VIII: Hearing is intact to voice IX, X: Palate elevation is symmetric. Phonation normal.  XI: Normal sternocleidomastoid and trapezius muscle strength XII: Tongue protrudes midline without fasciculations.   Motor: 5/5 strength is all muscle groups.  Patient does  have noted tremulousness with straight leg raise bilaterally with complaints of easy fatigability.  There is no tremulousness with bilateral upper extremity antigravity movement but patient complains of easy fatigability.  Tone is normal. Bulk is normal.  Sensation: Intact to light touch bilaterally in all four extremities.  Coordination: FTN intact bilaterally. HKS intact bilaterally. DTRs: 2+ and  symmetric throughout.  Gait: Deferred  Labs I have reviewed labs in epic and the results pertinent to this consultation are: CBC    Component Value Date/Time   WBC 7.8 08/11/2021 1919   RBC 5.60 08/11/2021 1919   HGB 17.6 (H) 08/11/2021 1919   HCT 50.6 08/11/2021 1919   PLT 296 08/11/2021 1919   MCV 90.4 08/11/2021 1919   MCH 31.4 08/11/2021 1919   MCHC 34.8 08/11/2021 1919   RDW 12.6 08/11/2021 1919   CMP     Component Value Date/Time   NA 137 08/11/2021 1919   K 3.7 08/11/2021 1919   CL 101 08/11/2021 1919   CO2 25 08/11/2021 1919   GLUCOSE 104 (H) 08/11/2021 1919   BUN 12 08/11/2021 1919   CREATININE 1.02 08/11/2021 1919   CALCIUM 9.8 08/11/2021 1919   GFRNONAA >60 08/11/2021 1919   Lipid Panel  No results found for: CHOL, TRIG, HDL, CHOLHDL, VLDL, LDLCALC, LDLDIRECT  Lab Results  Component Value Date   CKTOTAL 84 08/11/2021   Imaging I have reviewed the images obtained:  MRI examination of the brain wwo 11/24: Normal brain MRI.  MRI cervical, thoracic, and lumbar spine 11/24: Normal MRI of the cervical, thoracic, and lumbar spine.  Assessment: 20 y.o. male with no significant medical history who presented to the ED from UC for evaluation of 10 days of anterior thigh aching, proximal muscle fatigability, and tremulousness with strength testing. Patient denies gait changes though his mother expresses concern for leg shaking with standing.  - Examination reveals patient with 5/5 strength, no complaints of pain, and tremulousness with straight leg raise bilaterally. Patient complains of easy fatigability throughout. His reflexes remain intact. - MRI brain, cervical, thoracic, and lumbar spine are normal.  - Patient's presentation is felt to most likely be related to a viral / post-viral syndrome with body aches and generalized weakness and less likely felt to be related to acute neurologic process.   Recommendations: - No further inpatient neurologic w/u indicated  at this time  Pt seen by NP/Neuro and later by MD. Note/plan to be edited by MD as needed.  Lanae Boast, AGAC-NP [facility] Pager: [phone]    Attending Neurohospitalist Addendum Patient seen and examined with APP/Resident. Agree with the history and physical as documented above. Agree with the plan as documented, which I helped formulate. I have independently reviewed the chart, obtained history, review of systems and examined the patient.I have personally reviewed pertinent head/neck/spine imaging (CT/MRI). Patient has 5/5 strength in all muscle groups and no fasciculations or muscle twitches on my exam. CK and MRI neuro-axis unremarkable. Reflexes normal. No further inpatient neurologic workup indicated. I will arrange neuro f/u with Dr. Sherryll Burger in 6 wks; if patient's sx resolve before that time he may cancel appt. If true objective weakness or fasciculations seen on his outpatient f/u exam, would consider EMG at that time.  -- Bing Neighbors, MD [facility] [phone]  If 7pm- 7am, please page neurology on call as listed in AMION.

## 2021-08-12 NOTE — ED Notes (Signed)
Called [facility] [facility], spoke to charge [place], Charity fundraiser. Was made aware pt would arrive POV for MRI.

## 2021-08-12 NOTE — ED Provider Notes (Signed)
  Physical Exam  BP 118/73   Pulse 66   Temp 98.5 F (36.9 C) (Oral)   Resp 17   Ht 5\' 11"  (1.803 m)   Wt 80.7 kg   SpO2 98%   BMI 24.83 kg/m   Physical Exam  ED Course/Procedures     Procedures  MDM   Patient signed out to me by L. , PA-C.  Please see previous notes for further history.  In brief, patient presenting for evaluation of muscle twitching and weakness.  Began in the proximal muscle groups (upper and lower) about a week ago.  Now has generalized weakness and feels easily fatigued.  MRI is negative for acute findings.  Neuro has been consulted, will evaluate the patient and give recommendations regarding dispo and plan.  Discussed with Dr. Allyne Gee of neurology who recommends patient be discharged.  She will set up outpatient follow-up in about 6 weeks with Dr. Selina Cooley from neurology.      Sherryll Burger, PA-C 08/12/21 08/14/21    8099, DO 08/12/21 1448

## 2021-08-12 NOTE — ED Provider Notes (Signed)
2:12 AM Transferred here from [facility] for MRI's brain and spine for evaluation of possible demyelinating diease or similar.  No prior MRI's, unsure of claustrophobia.  Ordered ativan PRN if needed.  Results for orders placed or performed during the hospital encounter of 08/11/21  Basic metabolic panel  Result Value Ref Range   Sodium 137 135 - 145 mmol/L   Potassium 3.7 3.5 - 5.1 mmol/L   Chloride 101 98 - 111 mmol/L   CO2 25 22 - 32 mmol/L   Glucose, Bld 104 (H) 70 - 99 mg/dL   BUN 12 6 - 20 mg/dL   Creatinine, Ser 8.18 0.61 - 1.24 mg/dL   Calcium 9.8 8.9 - 29.9 mg/dL   GFR, Estimated >37 >16 mL/min   Anion gap 11 5 - 15  CBC  Result Value Ref Range   WBC 7.8 4.0 - 10.5 K/uL   RBC 5.60 4.22 - 5.81 MIL/uL   Hemoglobin 17.6 (H) 13.0 - 17.0 g/dL   HCT 96.7 89.3 - 81.0 %   MCV 90.4 80.0 - 100.0 fL   MCH 31.4 26.0 - 34.0 pg   MCHC 34.8 30.0 - 36.0 g/dL   RDW 17.5 10.2 - 58.5 %   Platelets 296 150 - 400 K/uL   nRBC 0.0 0.0 - 0.2 %  Urinalysis, Routine w reflex microscopic Urine, Clean Catch  Result Value Ref Range   Color, Urine YELLOW YELLOW   APPearance CLEAR CLEAR   Specific Gravity, Urine <1.005 (L) 1.005 - 1.030   pH 6.5 5.0 - 8.0   Glucose, UA NEGATIVE NEGATIVE mg/dL   Hgb urine dipstick NEGATIVE NEGATIVE   Bilirubin Urine NEGATIVE NEGATIVE   Ketones, ur NEGATIVE NEGATIVE mg/dL   Protein, ur NEGATIVE NEGATIVE mg/dL   Nitrite NEGATIVE NEGATIVE   Leukocytes,Ua NEGATIVE NEGATIVE  CK  Result Value Ref Range   Total CK 84 49 - 397 U/L   MR Brain W and Wo Contrast  Result Date: 08/12/2021 CLINICAL DATA:  Muscle weakness and tremors with body twitching, rule out demyelinating disease. EXAM: MRI HEAD WITHOUT AND WITH CONTRAST TECHNIQUE: Multiplanar, multiecho pulse sequences of the brain and surrounding structures were obtained without and with intravenous contrast. CONTRAST:  71mL GADAVIST GADOBUTROL 1 MMOL/ML IV SOLN COMPARISON:  None. FINDINGS: Brain: No infarction,  hemorrhage, hydrocephalus, extra-axial collection or mass lesion. No abnormal enhancement. No white matter disease or atrophy Vascular: Normal flow voids. Skull and upper cervical spine: Normal marrow signal. Sinuses/Orbits: Negative. IMPRESSION: Normal brain MRI. Electronically Signed   By: Tiburcio Pea M.D.   On: 08/12/2021 06:11   MR Cervical Spine W or Wo Contrast  Result Date: 08/12/2021 CLINICAL DATA:  Demyelinating disease rule out. Muscle weakness and tremors in the legs with muscle twitching diffusely. EXAM: MRI CERVICAL, THORACIC AND LUMBAR SPINE WITHOUT AND WITH CONTRAST TECHNIQUE: Multiplanar and multiecho pulse sequences of the cervical spine, to include the craniocervical junction and cervicothoracic junction, and thoracic and lumbar spine, were obtained without and with intravenous contrast. CONTRAST:  55mL GADAVIST GADOBUTROL 1 MMOL/ML IV SOLN COMPARISON:  None. FINDINGS: MRI CERVICAL SPINE FINDINGS Alignment: Unremarkable allowing for patient positioning. Vertebrae: No fracture, evidence of discitis, or bone lesion. Cord: Normal signal and morphology. Posterior Fossa, vertebral arteries, paraspinal tissues: Negative. Disc levels: Diffusely preserved disc height and hydration. No facet spurring or impingement. MRI THORACIC SPINE FINDINGS Alignment:  Physiologic. Vertebrae: No fracture, evidence of discitis, or bone lesion. Cord:  Normal signal and morphology. Paraspinal and other soft tissues: Negative. Disc  levels: No degenerative changes or impingement. MRI LUMBAR SPINE FINDINGS Alignment:  Physiologic. Vertebrae:  No fracture, evidence of discitis, or bone lesion. Conus medullaris and cauda equina: Conus extends to the T12-L1 level. Conus and cauda equina appear normal. Paraspinal and other soft tissues: Negative. Disc levels: No degenerative changes or impingement. IMPRESSION: Normal MRI of the cervical, thoracic, and lumbar spine. Electronically Signed   By: Tiburcio Pea M.D.   On:  08/12/2021 05:41   MR THORACIC SPINE W WO CONTRAST  Result Date: 08/12/2021 CLINICAL DATA:  Demyelinating disease rule out. Muscle weakness and tremors in the legs with muscle twitching diffusely. EXAM: MRI CERVICAL, THORACIC AND LUMBAR SPINE WITHOUT AND WITH CONTRAST TECHNIQUE: Multiplanar and multiecho pulse sequences of the cervical spine, to include the craniocervical junction and cervicothoracic junction, and thoracic and lumbar spine, were obtained without and with intravenous contrast. CONTRAST:  55mL GADAVIST GADOBUTROL 1 MMOL/ML IV SOLN COMPARISON:  None. FINDINGS: MRI CERVICAL SPINE FINDINGS Alignment: Unremarkable allowing for patient positioning. Vertebrae: No fracture, evidence of discitis, or bone lesion. Cord: Normal signal and morphology. Posterior Fossa, vertebral arteries, paraspinal tissues: Negative. Disc levels: Diffusely preserved disc height and hydration. No facet spurring or impingement. MRI THORACIC SPINE FINDINGS Alignment:  Physiologic. Vertebrae: No fracture, evidence of discitis, or bone lesion. Cord:  Normal signal and morphology. Paraspinal and other soft tissues: Negative. Disc levels: No degenerative changes or impingement. MRI LUMBAR SPINE FINDINGS Alignment:  Physiologic. Vertebrae:  No fracture, evidence of discitis, or bone lesion. Conus medullaris and cauda equina: Conus extends to the T12-L1 level. Conus and cauda equina appear normal. Paraspinal and other soft tissues: Negative. Disc levels: No degenerative changes or impingement. IMPRESSION: Normal MRI of the cervical, thoracic, and lumbar spine. Electronically Signed   By: Tiburcio Pea M.D.   On: 08/12/2021 05:41   MR Lumbar Spine W Wo Contrast  Result Date: 08/12/2021 CLINICAL DATA:  Demyelinating disease rule out. Muscle weakness and tremors in the legs with muscle twitching diffusely. EXAM: MRI CERVICAL, THORACIC AND LUMBAR SPINE WITHOUT AND WITH CONTRAST TECHNIQUE: Multiplanar and multiecho pulse sequences  of the cervical spine, to include the craniocervical junction and cervicothoracic junction, and thoracic and lumbar spine, were obtained without and with intravenous contrast. CONTRAST:  19mL GADAVIST GADOBUTROL 1 MMOL/ML IV SOLN COMPARISON:  None. FINDINGS: MRI CERVICAL SPINE FINDINGS Alignment: Unremarkable allowing for patient positioning. Vertebrae: No fracture, evidence of discitis, or bone lesion. Cord: Normal signal and morphology. Posterior Fossa, vertebral arteries, paraspinal tissues: Negative. Disc levels: Diffusely preserved disc height and hydration. No facet spurring or impingement. MRI THORACIC SPINE FINDINGS Alignment:  Physiologic. Vertebrae: No fracture, evidence of discitis, or bone lesion. Cord:  Normal signal and morphology. Paraspinal and other soft tissues: Negative. Disc levels: No degenerative changes or impingement. MRI LUMBAR SPINE FINDINGS Alignment:  Physiologic. Vertebrae:  No fracture, evidence of discitis, or bone lesion. Conus medullaris and cauda equina: Conus extends to the T12-L1 level. Conus and cauda equina appear normal. Paraspinal and other soft tissues: Negative. Disc levels: No degenerative changes or impingement. IMPRESSION: Normal MRI of the cervical, thoracic, and lumbar spine. Electronically Signed   By: Tiburcio Pea M.D.   On: 08/12/2021 05:41     6:14 AM MRI's are all negative. Discussed with neurology, Dr. Derry Lory-- will evaluate in the ED and provide further recommendations.  Care will be signed out to oncoming team pending neuro evaluation.   Garlon Hatchet, PA-C 08/12/21 [id number]    Glynn Octave, MD 08/12/21 1455

## 2021-08-13 ENCOUNTER — Other Ambulatory Visit: Payer: Self-pay | Admitting: Neurology

## 2021-08-13 DIAGNOSIS — R531 Weakness: Secondary | ICD-10-CM | POA: Insufficient documentation

## 2021-08-13 DIAGNOSIS — M791 Myalgia, unspecified site: Secondary | ICD-10-CM

## 2021-08-13 NOTE — Progress Notes (Unsigned)
Amb ref to neuro placed.  Bing Neighbors, MD [facility] [phone]  If 7pm- 7am, please page neurology on call as listed in AMION.

## 2021-10-14 NOTE — Progress Notes (Signed)
BP 138/83    Pulse 93    Temp 99.1 F (37.3 C) (Oral)    Ht 5' 10.91" (1.801 m)    Wt 190 lb 12.8 oz (86.5 kg)    SpO2 99%    BMI 26.68 kg/m    Subjective:    Patient ID: Gabriel Gibson, male    DOB: 27-Nov-2000, 21 y.o.   MRN: [id number]  HPI: DEMETRIES Gibson is a 21 y.o. male  Chief Complaint  Patient presents with   New Patient (Initial Visit)    Patient states that he legs and arms feel tired and the muscle in his leg twitch, his hands are red and he shakes real bad.    Patient presents to clinic to establish care with new PCP.  Introduced to Publishing rights manager role and practice setting.  All questions answered.  Discussed provider/patient relationship and expectations.  Denies any surgical history.  Patient denies a history of: Hypertension, Elevated Cholesterol, Diabetes, Thyroid problems, Depression, Anxiety, Neurological problems, and Abdominal problems.   Flowsheet Row Office Visit from 10/15/2021 in [facility]  PHQ-9 Total Score 0     Patient states he has been having abdominal pain for the last year and a half. Patient states his muscles twitch in his calf's. His legs and arms feel uncomfortable.  States he shakes a lot.  His hands and feet feel hot and and his hands stay hot all day long.  Patient drinks 1-8 bottles of water per day.  Patient states his symptoms started about 4 months ago.  The symptoms have gotten a little bit better recently.  Hasn't done anything different to help them improve.  The symptoms are the same throughout the day.  Nothing makes the symptoms worse or better.  Denies stiffness or pain in his hands when he wakes.  Also feels like he has a lot of fatigue.  Patient also feels like he has had some palpitations lately and HR has been elevated.   Active Ambulatory Problems    Diagnosis Date Noted   Subjective weakness    Resolved Ambulatory Problems    Diagnosis Date Noted   No Resolved Ambulatory Problems   Past Medical History:   Diagnosis Date   Asthma    History reviewed. No pertinent surgical history. Family History  Problem Relation Age of Onset   Other Mother        unknown medical history   Healthy Father      Review of Systems  Constitutional:  Positive for fatigue.  Musculoskeletal:  Positive for myalgias.       Hand pain.  Muscle twitching  Skin:        Hands always red   Per HPI unless specifically indicated above     Objective:    BP 138/83    Pulse 93    Temp 99.1 F (37.3 C) (Oral)    Ht 5' 10.91" (1.801 m)    Wt 190 lb 12.8 oz (86.5 kg)    SpO2 99%    BMI 26.68 kg/m   Wt Readings from Last 3 Encounters:  10/15/21 190 lb 12.8 oz (86.5 kg)  08/11/21 178 lb (80.7 kg)  06/28/20 178 lb (80.7 kg)    Physical Exam Vitals and nursing note reviewed.  Constitutional:      General: He is not in acute distress.    Appearance: Normal appearance. He is not ill-appearing, toxic-appearing or diaphoretic.  HENT:     Head: Normocephalic.  Right Ear: External ear normal.     Left Ear: External ear normal.     Nose: Nose normal. No congestion or rhinorrhea.     Mouth/Throat:     Mouth: Mucous membranes are moist.  Eyes:     General:        Right eye: No discharge.        Left eye: No discharge.     Extraocular Movements: Extraocular movements intact.     Conjunctiva/sclera: Conjunctivae normal.     Pupils: Pupils are equal, round, and reactive to light.  Cardiovascular:     Rate and Rhythm: Normal rate and regular rhythm.     Heart sounds: No murmur heard. Pulmonary:     Effort: Pulmonary effort is normal. No respiratory distress.     Breath sounds: Normal breath sounds. No wheezing, rhonchi or rales.  Abdominal:     General: Abdomen is flat. Bowel sounds are normal.  Musculoskeletal:     Cervical back: Normal range of motion and neck supple.  Skin:    General: Skin is warm and dry.     Capillary Refill: Capillary refill takes less than 2 seconds.       Neurological:      General: No focal deficit present.     Mental Status: He is alert and oriented to person, place, and time.  Psychiatric:        Mood and Affect: Mood normal.        Behavior: Behavior normal.        Thought Content: Thought content normal.        Judgment: Judgment normal.    Results for orders placed or performed during the hospital encounter of 08/11/21  Basic metabolic panel  Result Value Ref Range   Sodium 137 135 - 145 mmol/L   Potassium 3.7 3.5 - 5.1 mmol/L   Chloride 101 98 - 111 mmol/L   CO2 25 22 - 32 mmol/L   Glucose, Bld 104 (H) 70 - 99 mg/dL   BUN 12 6 - 20 mg/dL   Creatinine, Ser 0.86 0.61 - 1.24 mg/dL   Calcium 9.8 8.9 - 57.8 mg/dL   GFR, Estimated >46 >96 mL/min   Anion gap 11 5 - 15  CBC  Result Value Ref Range   WBC 7.8 4.0 - 10.5 K/uL   RBC 5.60 4.22 - 5.81 MIL/uL   Hemoglobin 17.6 (H) 13.0 - 17.0 g/dL   HCT 29.5 28.4 - 13.2 %   MCV 90.4 80.0 - 100.0 fL   MCH 31.4 26.0 - 34.0 pg   MCHC 34.8 30.0 - 36.0 g/dL   RDW 44.0 10.2 - 72.5 %   Platelets 296 150 - 400 K/uL   nRBC 0.0 0.0 - 0.2 %  Urinalysis, Routine w reflex microscopic Urine, Clean Catch  Result Value Ref Range   Color, Urine YELLOW YELLOW   APPearance CLEAR CLEAR   Specific Gravity, Urine <1.005 (L) 1.005 - 1.030   pH 6.5 5.0 - 8.0   Glucose, UA NEGATIVE NEGATIVE mg/dL   Hgb urine dipstick NEGATIVE NEGATIVE   Bilirubin Urine NEGATIVE NEGATIVE   Ketones, ur NEGATIVE NEGATIVE mg/dL   Protein, ur NEGATIVE NEGATIVE mg/dL   Nitrite NEGATIVE NEGATIVE   Leukocytes,Ua NEGATIVE NEGATIVE  CK  Result Value Ref Range   Total CK 84 49 - 397 U/L      Assessment & Plan:   Problem List Items Addressed This Visit   None Visit Diagnoses  Palpitations    -  Primary   EKG done in office and showed NSR. Results discussed with patient during visit. If symptoms persist will send to Cardiology for holiter.   Relevant Orders   EKG 12-Lead (Completed)   Other fatigue       Symptoms have been ongoing  to about 4 months. Will order labs to rule out autoimmune vs tick disease. Will follow up in 2 weeks to discuss results.   Relevant Orders   Rheumatoid factor   ANA   ANA+ENA+DNA/DS+Antich+Centr   CK   Rocky mtn spotted fvr abs pnl(IgG+IgM)   Uric acid   Ehrlichia antibody panel   Babesia microti Antibody Panel   Comprehensive metabolic panel   CBC with Differential/Platelet   Thyroid Panel With TSH   Vitamin D (25 hydroxy)   B12   Shaking       Relevant Orders   Rheumatoid factor   ANA   ANA+ENA+DNA/DS+Antich+Centr   CK   Rocky mtn spotted fvr abs pnl(IgG+IgM)   Uric acid   Ehrlichia antibody panel   Babesia microti Antibody Panel   Comprehensive metabolic panel   CBC with Differential/Platelet   Thyroid Panel With TSH   Vitamin D (25 hydroxy)   B12   Muscle spasms of both lower extremities       Relevant Orders   Rheumatoid factor   ANA   ANA+ENA+DNA/DS+Antich+Centr   CK   Rocky mtn spotted fvr abs pnl(IgG+IgM)   Uric acid   Ehrlichia antibody panel   Babesia microti Antibody Panel   Comprehensive metabolic panel   CBC with Differential/Platelet   Thyroid Panel With TSH   Vitamin D (25 hydroxy)   B12   Pain in both hands       Relevant Orders   Rheumatoid factor   ANA   ANA+ENA+DNA/DS+Antich+Centr   CK   Rocky mtn spotted fvr abs pnl(IgG+IgM)   Uric acid   Ehrlichia antibody panel   Babesia microti Antibody Panel   Comprehensive metabolic panel   CBC with Differential/Platelet   Thyroid Panel With TSH   Vitamin D (25 hydroxy)   B12   Encounter to establish care            Follow up plan: Return in about 2 weeks (around 10/29/2021) for Lab results.

## 2021-10-15 ENCOUNTER — Encounter: Payer: Self-pay | Admitting: Nurse Practitioner

## 2021-10-15 ENCOUNTER — Ambulatory Visit: Payer: 59 | Admitting: Nurse Practitioner

## 2021-10-15 ENCOUNTER — Other Ambulatory Visit: Payer: Self-pay

## 2021-10-15 VITALS — BP 138/83 | HR 93 | Temp 99.1°F | Ht 70.91 in | Wt 190.8 lb

## 2021-10-15 DIAGNOSIS — M62838 Other muscle spasm: Secondary | ICD-10-CM

## 2021-10-15 DIAGNOSIS — R002 Palpitations: Secondary | ICD-10-CM | POA: Diagnosis not present

## 2021-10-15 DIAGNOSIS — R5383 Other fatigue: Secondary | ICD-10-CM

## 2021-10-15 DIAGNOSIS — M79641 Pain in right hand: Secondary | ICD-10-CM

## 2021-10-15 DIAGNOSIS — R251 Tremor, unspecified: Secondary | ICD-10-CM | POA: Diagnosis not present

## 2021-10-15 DIAGNOSIS — Z7689 Persons encountering health services in other specified circumstances: Secondary | ICD-10-CM

## 2021-10-15 DIAGNOSIS — M79642 Pain in left hand: Secondary | ICD-10-CM

## 2021-10-15 NOTE — Progress Notes (Signed)
Results discussed with patient during visit.

## 2021-10-20 MED ORDER — DOXYCYCLINE HYCLATE 100 MG PO TABS: 100.0000 mg | ORAL_TABLET | Freq: Two times a day (BID) | ORAL | 0 refills | Status: DC

## 2021-10-20 MED ORDER — VITAMIN D (ERGOCALCIFEROL) 1.25 MG (50000 UNIT) PO CAPS: 50000.0000 [IU] | ORAL_CAPSULE | ORAL | 1 refills | Status: DC

## 2021-10-20 NOTE — Addendum Note (Signed)
Addended by: Jon Billings on: 10/20/2021 10:48 AM   Modules accepted: Orders

## 2021-10-20 NOTE — Progress Notes (Signed)
Please let patient know that his lab work shows that his vitamin D is low.  This could be contributing to his pain, muscle aches, and fatigue.  He also had a positive rocky mountain spotted fever.  I am going to send him a prescription for Doxycycline twice daily for 3 weeks for the rocky mountain spotted fever and a prescription for vitamin D.  Please let me know if he has any questions.  We can keep the appointment for February 10th if he has questions or we can push it out to 1 month for follow up.

## 2021-10-22 ENCOUNTER — Telehealth: Payer: Self-pay | Admitting: Nurse Practitioner

## 2021-10-22 LAB — CBC WITH DIFFERENTIAL/PLATELET
Basophils Absolute: 0.1 10*3/uL (ref 0.0–0.2)
Basos: 1 %
EOS (ABSOLUTE): 0.1 10*3/uL (ref 0.0–0.4)
Eos: 3 %
Hematocrit: 48.9 % (ref 37.5–51.0)
Hemoglobin: 16.9 g/dL (ref 13.0–17.7)
Immature Grans (Abs): 0 10*3/uL (ref 0.0–0.1)
Immature Granulocytes: 0 %
Lymphocytes Absolute: 1.1 10*3/uL (ref 0.7–3.1)
Lymphs: 22 %
MCH: 30.8 pg (ref 26.6–33.0)
MCHC: 34.6 g/dL (ref 31.5–35.7)
MCV: 89 fL (ref 79–97)
Monocytes Absolute: 0.4 10*3/uL (ref 0.1–0.9)
Monocytes: 7 %
Neutrophils Absolute: 3.5 10*3/uL (ref 1.4–7.0)
Neutrophils: 67 %
Platelets: 270 10*3/uL (ref 150–450)
RBC: 5.49 x10E6/uL (ref 4.14–5.80)
RDW: 13 % (ref 11.6–15.4)
WBC: 5.2 10*3/uL (ref 3.4–10.8)

## 2021-10-22 LAB — COMPREHENSIVE METABOLIC PANEL
ALT: 22 IU/L (ref 0–44)
AST: 18 IU/L (ref 0–40)
Albumin/Globulin Ratio: 2.5 — ABNORMAL HIGH (ref 1.2–2.2)
Albumin: 4.9 g/dL (ref 4.1–5.2)
Alkaline Phosphatase: 51 IU/L (ref 44–121)
BUN/Creatinine Ratio: 7 — ABNORMAL LOW (ref 9–20)
BUN: 8 mg/dL (ref 6–20)
Bilirubin Total: 0.8 mg/dL (ref 0.0–1.2)
CO2: 29 mmol/L (ref 20–29)
Calcium: 9.8 mg/dL (ref 8.7–10.2)
Chloride: 100 mmol/L (ref 96–106)
Creatinine, Ser: 1.1 mg/dL (ref 0.76–1.27)
Globulin, Total: 2 g/dL (ref 1.5–4.5)
Glucose: 83 mg/dL (ref 70–99)
Potassium: 4.1 mmol/L (ref 3.5–5.2)
Sodium: 141 mmol/L (ref 134–144)
Total Protein: 6.9 g/dL (ref 6.0–8.5)
eGFR: 97 mL/min/{1.73_m2} (ref >59)

## 2021-10-22 LAB — ANA+ENA+DNA/DS+ANTICH+CENTR
ANA Titer 1: NEGATIVE
Anti JO-1: <0.2 AI (ref 0.0–0.9)
Centromere Ab Screen: <0.2 AI (ref 0.0–0.9)
Chromatin Ab SerPl-aCnc: <0.2 AI (ref 0.0–0.9)
ENA RNP Ab: <0.2 AI (ref 0.0–0.9)
ENA SM Ab Ser-aCnc: <0.2 AI (ref 0.0–0.9)
ENA SSA (RO) Ab: 0.4 AI (ref 0.0–0.9)
ENA SSB (LA) Ab: <0.2 AI (ref 0.0–0.9)
Scleroderma (Scl-70) (ENA) Antibody, IgG: <0.2 AI (ref 0.0–0.9)
dsDNA Ab: <1 IU/mL (ref 0–9)

## 2021-10-22 LAB — ROCKY MTN SPOTTED FVR ABS PNL(IGG+IGM)
RMSF IgG: POSITIVE — AB
RMSF IgM: 0.31 index (ref 0.00–0.89)

## 2021-10-22 LAB — EHRLICHIA ANTIBODY PANEL
E. Chaffeensis (HME) IgM Titer: NEGATIVE
E.Chaffeensis (HME) IgG: NEGATIVE
HGE IgG Titer: NEGATIVE
HGE IgM Titer: NEGATIVE

## 2021-10-22 LAB — BABESIA MICROTI ANTIBODY PANEL
Babesia microti IgG: <1:10 {titer}
Babesia microti IgM: <1:10 {titer}

## 2021-10-22 LAB — THYROID PANEL WITH TSH
Free Thyroxine Index: 2 (ref 1.2–4.9)
T3 Uptake Ratio: 28 % (ref 24–39)
T4, Total: 7.2 ug/dL (ref 4.5–12.0)
TSH: 2.05 u[IU]/mL (ref 0.450–4.500)

## 2021-10-22 LAB — ANA: Anti Nuclear Antibody (ANA): NEGATIVE

## 2021-10-22 LAB — RHEUMATOID FACTOR: Rheumatoid fact SerPl-aCnc: <10 IU/mL (ref <14.0)

## 2021-10-22 LAB — RMSF, IGG, IFA: RMSF, IGG, IFA: <1:64 {titer}

## 2021-10-22 LAB — URIC ACID: Uric Acid: 6.4 mg/dL (ref 3.8–8.4)

## 2021-10-22 LAB — CK: Total CK: 75 U/L (ref 49–439)

## 2021-10-22 LAB — VITAMIN D 25 HYDROXY (VIT D DEFICIENCY, FRACTURES): Vit D, 25-Hydroxy: 19 ng/mL — ABNORMAL LOW (ref 30.0–100.0)

## 2021-10-22 LAB — VITAMIN B12: Vitamin B-12: 446 pg/mL (ref 232–1245)

## 2021-10-22 NOTE — Telephone Encounter (Signed)
Called and left patient's mother a VM asking for her to please return my call.  

## 2021-10-22 NOTE — Telephone Encounter (Signed)
Patient's mother called, on DPR, and advised that the Doxycycline 100 mg BID was prescribed for the [facility] Spotted Fever to take x 3 weeks. Advised to follow that recommended dose. She says for a month he's been taking Doxycycline 100 mg daily for acne and if he will need to continue on that after the 3 weeks. She asked did the once a day help the [facility] Spotted Fever. I advised it did help some, but the dosage prescribed BID will help more. Advised someone will call back with Karen's recommendation on the acne dosage. She verbalized understanding.

## 2021-10-22 NOTE — Telephone Encounter (Signed)
Copied from [place] [id number]. Topic: General - Other >> Oct 22, 2021  9:32 AM Leward Quan A wrote: Reason for CRM: Brett Fairy patient mother called in to inform Veva Holes that patient have been taking the doxycycline (VIBRA-TABS) 100 MG tablet once a day for a month now for acne. She is asking if he should still fill Rx sent on 10/20/21 for same medication only to take twice daily as prescribed and want to know if this will help patient any with the [facility] spotted disease he was told that he has. Would like a call back from nurse to discuss. Can be reached at Ph# [phone]

## 2021-10-22 NOTE — Telephone Encounter (Signed)
Please let patient's mom know that he should take the medication twice daily for the 21 days.  Once he completes the course he can resume the doxycyline daily.  He did not make [place] aware of the medication during the visit.  Are there any other medications that he is on?

## 2021-10-25 NOTE — Telephone Encounter (Signed)
Called and LVM asking for patient's mother to please return my call.

## 2021-10-26 NOTE — Telephone Encounter (Signed)
Pt mother returned Brittany's call/ she asked if next time if office can leave a detailed message if she doesn't answer./ she may be sleep after work

## 2021-10-27 NOTE — Telephone Encounter (Signed)
Left detailed message for patient mother to give our office a call back if she has any questions or concerns regarding Karen's recommendations to patient's medications.

## 2021-10-29 ENCOUNTER — Ambulatory Visit: Payer: 59 | Admitting: Nurse Practitioner

## 2021-10-29 ENCOUNTER — Other Ambulatory Visit: Payer: Self-pay

## 2021-10-29 ENCOUNTER — Encounter: Payer: Self-pay | Admitting: Nurse Practitioner

## 2021-10-29 VITALS — BP 121/69 | HR 70 | Temp 98.7°F | Wt 188.8 lb

## 2021-10-29 DIAGNOSIS — R5383 Other fatigue: Secondary | ICD-10-CM

## 2021-10-29 DIAGNOSIS — Z712 Person consulting for explanation of examination or test findings: Secondary | ICD-10-CM | POA: Diagnosis not present

## 2021-10-29 DIAGNOSIS — A770 Spotted fever due to Rickettsia rickettsii: Secondary | ICD-10-CM | POA: Diagnosis not present

## 2021-10-29 MED ORDER — VITAMIN D 25 MCG (1000 UNIT) PO TABS: 1000.0000 [IU] | ORAL_TABLET | Freq: Every day | ORAL | 0 refills | Status: DC

## 2021-10-29 MED ORDER — VITAMIN B-12 1000 MCG PO TABS: 1000.0000 ug | ORAL_TABLET | Freq: Every day | ORAL | 1 refills | Status: DC

## 2021-10-29 NOTE — Progress Notes (Signed)
BP 121/69    Pulse 70    Temp 98.7 F (37.1 C) (Oral)    Wt 188 lb 12.8 oz (85.6 kg)    SpO2 98%    BMI 26.40 kg/m    Subjective:    Patient ID: Gabriel Gibson, male    DOB: 18-Feb-2001, 21 y.o.   MRN: [id number]  HPI: Gabriel Gibson is a 21 y.o. male  Chief Complaint  Patient presents with   Lab Results   Patient presents to clinic to discuss lab results.  States symptoms are the same.  He has not started the Vitamin D.  However, he is taking the doxycycline for the [place] spotted fever which is helping improve his symptoms.    Relevant past medical, surgical, family and social history reviewed and updated as indicated. Interim medical history since our last visit reviewed. Allergies and medications reviewed and updated.  Review of Systems  Constitutional:  Positive for fatigue.  Musculoskeletal:  Positive for myalgias.   Per HPI unless specifically indicated above     Objective:    BP 121/69    Pulse 70    Temp 98.7 F (37.1 C) (Oral)    Wt 188 lb 12.8 oz (85.6 kg)    SpO2 98%    BMI 26.40 kg/m   Wt Readings from Last 3 Encounters:  10/29/21 188 lb 12.8 oz (85.6 kg)  10/15/21 190 lb 12.8 oz (86.5 kg)  08/11/21 178 lb (80.7 kg)    Physical Exam Vitals and nursing note reviewed.  Constitutional:      General: He is not in acute distress.    Appearance: Normal appearance. He is not ill-appearing, toxic-appearing or diaphoretic.  HENT:     Head: Normocephalic.     Right Ear: External ear normal.     Left Ear: External ear normal.     Nose: Nose normal. No congestion or rhinorrhea.     Mouth/Throat:     Mouth: Mucous membranes are moist.  Eyes:     General:        Right eye: No discharge.        Left eye: No discharge.     Extraocular Movements: Extraocular movements intact.     Conjunctiva/sclera: Conjunctivae normal.     Pupils: Pupils are equal, round, and reactive to light.  Cardiovascular:     Rate and Rhythm: Normal rate and regular rhythm.     Heart  sounds: No murmur heard. Pulmonary:     Effort: Pulmonary effort is normal. No respiratory distress.     Breath sounds: Normal breath sounds. No wheezing, rhonchi or rales.  Abdominal:     General: Abdomen is flat. Bowel sounds are normal.  Musculoskeletal:     Cervical back: Normal range of motion and neck supple.  Skin:    General: Skin is warm and dry.     Capillary Refill: Capillary refill takes less than 2 seconds.  Neurological:     General: No focal deficit present.     Mental Status: He is alert and oriented to person, place, and time.  Psychiatric:        Mood and Affect: Mood normal.        Behavior: Behavior normal.        Thought Content: Thought content normal.        Judgment: Judgment normal.    Results for orders placed or performed in visit on 10/15/21  Rheumatoid factor  Result Value Ref Range  Rhuematoid fact SerPl-aCnc <10.0 <14.0 IU/mL  ANA  Result Value Ref Range   Anti Nuclear Antibody (ANA) Negative Negative  ANA+ENA+DNA/DS+Antich+Centr  Result Value Ref Range   ANA Titer 1 Negative    dsDNA Ab <1 0 - 9 IU/mL   ENA RNP Ab <0.2 0.0 - 0.9 AI   ENA SM Ab Ser-aCnc <0.2 0.0 - 0.9 AI   Scleroderma (Scl-70) (ENA) Antibody, IgG <0.2 0.0 - 0.9 AI   ENA SSA (RO) Ab 0.4 0.0 - 0.9 AI   ENA SSB (LA) Ab <0.2 0.0 - 0.9 AI   Chromatin Ab SerPl-aCnc <0.2 0.0 - 0.9 AI   Anti JO-1 <0.2 0.0 - 0.9 AI   Centromere Ab Screen <0.2 0.0 - 0.9 AI   See below: Comment   CK  Result Value Ref Range   Total CK 75 49 - 439 U/L  Rocky mtn spotted fvr abs pnl(IgG+IgM)  Result Value Ref Range   RMSF IgG Positive (A) Negative   RMSF IgM 0.31 0.00 - 0.89 index  Uric acid  Result Value Ref Range   Uric Acid 6.4 3.8 - 8.4 mg/dL  Ehrlichia antibody panel  Result Value Ref Range   E.Chaffeensis (HME) IgG Negative Neg:<1:64   E. Chaffeensis (HME) IgM Titer Negative Neg:<1:20   HGE IgG Titer Negative Neg:<1:64   HGE IgM Titer Negative Neg:<1:20  Babesia microti Antibody Panel   Result Value Ref Range   Babesia microti IgM <1:10 Neg:<1:10   Babesia microti IgG <1:10 Neg:<1:10  Comprehensive metabolic panel  Result Value Ref Range   Glucose 83 70 - 99 mg/dL   BUN 8 6 - 20 mg/dL   Creatinine, Ser 1.10 0.76 - 1.27 mg/dL   eGFR 97 >59 mL/min/1.73   BUN/Creatinine Ratio 7 (L) 9 - 20   Sodium 141 134 - 144 mmol/L   Potassium 4.1 3.5 - 5.2 mmol/L   Chloride 100 96 - 106 mmol/L   CO2 29 20 - 29 mmol/L   Calcium 9.8 8.7 - 10.2 mg/dL   Total Protein 6.9 6.0 - 8.5 g/dL   Albumin 4.9 4.1 - 5.2 g/dL   Globulin, Total 2.0 1.5 - 4.5 g/dL   Albumin/Globulin Ratio 2.5 (H) 1.2 - 2.2   Bilirubin Total 0.8 0.0 - 1.2 mg/dL   Alkaline Phosphatase 51 44 - 121 IU/L   AST 18 0 - 40 IU/L   ALT 22 0 - 44 IU/L  CBC with Differential/Platelet  Result Value Ref Range   WBC 5.2 3.4 - 10.8 x10E3/uL   RBC 5.49 4.14 - 5.80 x10E6/uL   Hemoglobin 16.9 13.0 - 17.7 g/dL   Hematocrit 48.9 37.5 - 51.0 %   MCV 89 79 - 97 fL   MCH 30.8 26.6 - 33.0 pg   MCHC 34.6 31.5 - 35.7 g/dL   RDW 13.0 11.6 - 15.4 %   Platelets 270 150 - 450 x10E3/uL   Neutrophils 67 Not Estab. %   Lymphs 22 Not Estab. %   Monocytes 7 Not Estab. %   Eos 3 Not Estab. %   Basos 1 Not Estab. %   Neutrophils Absolute 3.5 1.4 - 7.0 x10E3/uL   Lymphocytes Absolute 1.1 0.7 - 3.1 x10E3/uL   Monocytes Absolute 0.4 0.1 - 0.9 x10E3/uL   EOS (ABSOLUTE) 0.1 0.0 - 0.4 x10E3/uL   Basophils Absolute 0.1 0.0 - 0.2 x10E3/uL   Immature Granulocytes 0 Not Estab. %   Immature Grans (Abs) 0.0 0.0 - 0.1 x10E3/uL  Thyroid Panel With  TSH  Result Value Ref Range   TSH 2.050 0.450 - 4.500 uIU/mL   T4, Total 7.2 4.5 - 12.0 ug/dL   T3 Uptake Ratio 28 24 - 39 %   Free Thyroxine Index 2.0 1.2 - 4.9  Vitamin D (25 hydroxy)  Result Value Ref Range   Vit D, 25-Hydroxy 19.0 (L) 30.0 - 100.0 ng/mL  B12  Result Value Ref Range   Vitamin B-12 446 232 - 1,245 pg/mL  RMSF, IgG, IFA  Result Value Ref Range   RMSF, IGG, IFA <1:64 Neg <1:64       Assessment & Plan:   Problem List Items Addressed This Visit   None Visit Diagnoses     [place] spotted fever    -  Primary   Started treatment for RMSF. On Doxycycline 119m BID x 3 weeks. Tolerating medication well.  Discussed continuing with treatment and follow up in 2 months.   Other fatigue       Vitamin D 1000 IU daily sent to the pharmacy. Follow up in 2 months and will repeat lab work .   Encounter to discuss test results       Reviewed test results from two weeks ago with patient during visit. Will start Vitamin D. Continue with treatment for RMSF.        Follow up plan: Return in about 2 months (around 12/27/2021) for Vitamin D check.

## 2022-01-04 NOTE — Progress Notes (Signed)
? ?BP 131/83   Pulse 61   Temp 98.4 ?F (36.9 ?C) (Oral)   Wt 187 lb 3.2 oz (84.9 kg)   SpO2 98%   BMI 26.18 kg/m?   ? ?Subjective:  ? ? Patient ID: Gabriel Gibson, male    DOB: 07-16-2001, 21 y.o.   MRN: [id number] ? ?HPI: ?Gabriel Gibson is a 21 y.o. male ? ?Chief Complaint  ?Patient presents with  ? Follow-up  ?  Muscle spasms constantly , affecting sleep per pt.   ? ?Patient presents stating that symptoms are about the same as last time.  Still having fatigue.  He is having muscle cramps and twitching in his calves all day long.   ? ?RESTLESS LEGS ?Duration: months ?Discomfort description:   twitching, burning, and cramping ?Pain: no ?Location: calves and sometimes in his quads ?Bilateral: yes ?Symmetric: yes ?Severity: moderate ?Onset:  sudden ?Frequency:  constant ?Symptoms only occur while legs at rest: yes ?Sudden unintentional leg jerking:  sometimes ?Bed partner bothered by leg movements: no ?LE numbness: yes ?Decreased sensation: no ?Weakness: yes ?Insomnia: yes ?Daytime somnolence: no ?Fatigue: yes ?Alleviating factors:  ?Aggravating factors:  ?Status: stable ? ?  ? ? ?Relevant past medical, surgical, family and social history reviewed and updated as indicated. Interim medical history since our last visit reviewed. ?Allergies and medications reviewed and updated. ? ?Review of Systems  ?Constitutional:  Positive for fatigue.  ?Musculoskeletal:  Positive for myalgias.  ?     Muscle twitching  ? ?Per HPI unless specifically indicated above ? ?   ?Objective:  ?  ?BP 131/83   Pulse 61   Temp 98.4 ?F (36.9 ?C) (Oral)   Wt 187 lb 3.2 oz (84.9 kg)   SpO2 98%   BMI 26.18 kg/m?   ?Wt Readings from Last 3 Encounters:  ?01/05/22 187 lb 3.2 oz (84.9 kg)  ?10/29/21 188 lb 12.8 oz (85.6 kg)  ?10/15/21 190 lb 12.8 oz (86.5 kg)  ?  ?Physical Exam ?Vitals and nursing note reviewed.  ?Constitutional:   ?   General: He is not in acute distress. ?   Appearance: Normal appearance. He is not ill-appearing, toxic-appearing  or diaphoretic.  ?HENT:  ?   Head: Normocephalic.  ?   Right Ear: External ear normal.  ?   Left Ear: External ear normal.  ?   Nose: Nose normal. No congestion or rhinorrhea.  ?   Mouth/Throat:  ?   Mouth: Mucous membranes are moist.  ?Eyes:  ?   General:     ?   Right eye: No discharge.     ?   Left eye: No discharge.  ?   Extraocular Movements: Extraocular movements intact.  ?   Conjunctiva/sclera: Conjunctivae normal.  ?   Pupils: Pupils are equal, round, and reactive to light.  ?Cardiovascular:  ?   Rate and Rhythm: Normal rate and regular rhythm.  ?   Heart sounds: No murmur heard. ?Pulmonary:  ?   Effort: Pulmonary effort is normal. No respiratory distress.  ?   Breath sounds: Normal breath sounds. No wheezing, rhonchi or rales.  ?Abdominal:  ?   General: Abdomen is flat. Bowel sounds are normal.  ?Musculoskeletal:  ?   Cervical back: Normal range of motion and neck supple.  ?Skin: ?   General: Skin is warm and dry.  ?   Capillary Refill: Capillary refill takes less than 2 seconds.  ?Neurological:  ?   General: No focal deficit present.  ?  Mental Status: He is alert and oriented to person, place, and time.  ?Psychiatric:     ?   Mood and Affect: Mood normal.     ?   Behavior: Behavior normal.     ?   Thought Content: Thought content normal.     ?   Judgment: Judgment normal.  ? ? ?Results for orders placed or performed in visit on 10/15/21  ?Rheumatoid factor  ?Result Value Ref Range  ? Rhuematoid fact SerPl-aCnc <10.0 <14.0 IU/mL  ?ANA  ?Result Value Ref Range  ? Anti Nuclear Antibody (ANA) Negative Negative  ?ANA+ENA+DNA/DS+Antich+Centr  ?Result Value Ref Range  ? ANA Titer 1 Negative   ? dsDNA Ab <1 0 - 9 IU/mL  ? ENA RNP Ab <0.2 0.0 - 0.9 AI  ? ENA SM Ab Ser-aCnc <0.2 0.0 - 0.9 AI  ? Scleroderma (Scl-70) (ENA) Antibody, IgG <0.2 0.0 - 0.9 AI  ? ENA SSA (RO) Ab 0.4 0.0 - 0.9 AI  ? ENA SSB (LA) Ab <0.2 0.0 - 0.9 AI  ? Chromatin Ab SerPl-aCnc <0.2 0.0 - 0.9 AI  ? Anti JO-1 <0.2 0.0 - 0.9 AI  ? Centromere  Ab Screen <0.2 0.0 - 0.9 AI  ? See below: Comment   ?CK  ?Result Value Ref Range  ? Total CK 75 49 - 439 U/L  ?Rocky mtn spotted fvr abs pnl(IgG+IgM)  ?Result Value Ref Range  ? RMSF IgG Positive (A) Negative  ? RMSF IgM 0.31 0.00 - 0.89 index  ?Uric acid  ?Result Value Ref Range  ? Uric Acid 6.4 3.8 - 8.4 mg/dL  ?Ehrlichia antibody panel  ?Result Value Ref Range  ? E.Chaffeensis (HME) IgG Negative Neg:<1:64  ? E. Chaffeensis (HME) IgM Titer Negative Neg:<1:20  ? HGE IgG Titer Negative Neg:<1:64  ? HGE IgM Titer Negative Neg:<1:20  ?Babesia microti Antibody Panel  ?Result Value Ref Range  ? Babesia microti IgM <1:10 Neg:<1:10  ? Babesia microti IgG <1:10 Neg:<1:10  ?Comprehensive metabolic panel  ?Result Value Ref Range  ? Glucose 83 70 - 99 mg/dL  ? BUN 8 6 - 20 mg/dL  ? Creatinine, Ser 1.10 0.76 - 1.27 mg/dL  ? eGFR 97 >59 mL/min/1.73  ? BUN/Creatinine Ratio 7 (L) 9 - 20  ? Sodium 141 134 - 144 mmol/L  ? Potassium 4.1 3.5 - 5.2 mmol/L  ? Chloride 100 96 - 106 mmol/L  ? CO2 29 20 - 29 mmol/L  ? Calcium 9.8 8.7 - 10.2 mg/dL  ? Total Protein 6.9 6.0 - 8.5 g/dL  ? Albumin 4.9 4.1 - 5.2 g/dL  ? Globulin, Total 2.0 1.5 - 4.5 g/dL  ? Albumin/Globulin Ratio 2.5 (H) 1.2 - 2.2  ? Bilirubin Total 0.8 0.0 - 1.2 mg/dL  ? Alkaline Phosphatase 51 44 - 121 IU/L  ? AST 18 0 - 40 IU/L  ? ALT 22 0 - 44 IU/L  ?CBC with Differential/Platelet  ?Result Value Ref Range  ? WBC 5.2 3.4 - 10.8 x10E3/uL  ? RBC 5.49 4.14 - 5.80 x10E6/uL  ? Hemoglobin 16.9 13.0 - 17.7 g/dL  ? Hematocrit 48.9 37.5 - 51.0 %  ? MCV 89 79 - 97 fL  ? MCH 30.8 26.6 - 33.0 pg  ? MCHC 34.6 31.5 - 35.7 g/dL  ? RDW 13.0 11.6 - 15.4 %  ? Platelets 270 150 - 450 x10E3/uL  ? Neutrophils 67 Not Estab. %  ? Lymphs 22 Not Estab. %  ? Monocytes 7 Not Estab. %  ?  Eos 3 Not Estab. %  ? Basos 1 Not Estab. %  ? Neutrophils Absolute 3.5 1.4 - 7.0 x10E3/uL  ? Lymphocytes Absolute 1.1 0.7 - 3.1 x10E3/uL  ? Monocytes Absolute 0.4 0.1 - 0.9 x10E3/uL  ? EOS (ABSOLUTE) 0.1 0.0 - 0.4  x10E3/uL  ? Basophils Absolute 0.1 0.0 - 0.2 x10E3/uL  ? Immature Granulocytes 0 Not Estab. %  ? Immature Grans (Abs) 0.0 0.0 - 0.1 x10E3/uL  ?Thyroid Panel With TSH  ?Result Value Ref Range  ? TSH 2.050 0.450 - 4.500 uIU/mL  ? T4, Total 7.2 4.5 - 12.0 ug/dL  ? T3 Uptake Ratio 28 24 - 39 %  ? Free Thyroxine Index 2.0 1.2 - 4.9  ?Vitamin D (25 hydroxy)  ?Result Value Ref Range  ? Vit D, 25-Hydroxy 19.0 (L) 30.0 - 100.0 ng/mL  ?B12  ?Result Value Ref Range  ? Vitamin B-12 446 232 - 1,245 pg/mL  ?RMSF, IgG, IFA  ?Result Value Ref Range  ? RMSF, IGG, IFA <1:64 Neg <1:64  ? ?   ?Assessment & Plan:  ? ?Problem List Items Addressed This Visit   ? ?  ? Other  ? Subjective weakness  ?  Ongoing. Not improved after RMSF treatment and starting Vitamin B12 and Vitamin D.  Will order labs to recheck levels.  ? ?  ?  ? Relevant Orders  ? Vitamin D (25 hydroxy)  ? RLS (restless legs syndrome) - Primary  ?  New diagnosis.  Will start Mirtazipine 0.50m at night.  Discussed side effects and benefits of medication with patient during visit.  Labs ordered today. Will make recommendations based on lab results.  Follow up in 1 month for reevaluation.  Can increase dose of Mirtazipine at that time if necessary.  May need referral to Neurology if symptoms do not improve. ? ?  ?  ? ?Other Visit Diagnoses   ? ? B12 deficiency      ? Labs ordered today. Will make recommendations based on lab results.  ? Relevant Orders  ? B12  ? ?  ?  ? ?Follow up plan: ?Return in about 1 month (around 02/04/2022) for RLS. ? ? ? ? ? ?

## 2022-01-05 ENCOUNTER — Encounter: Payer: Self-pay | Admitting: Nurse Practitioner

## 2022-01-05 ENCOUNTER — Ambulatory Visit (INDEPENDENT_AMBULATORY_CARE_PROVIDER_SITE_OTHER): Payer: 59 | Admitting: Nurse Practitioner

## 2022-01-05 VITALS — BP 131/83 | HR 61 | Temp 98.4°F | Wt 187.2 lb

## 2022-01-05 DIAGNOSIS — E538 Deficiency of other specified B group vitamins: Secondary | ICD-10-CM

## 2022-01-05 DIAGNOSIS — R531 Weakness: Secondary | ICD-10-CM

## 2022-01-05 DIAGNOSIS — G2581 Restless legs syndrome: Secondary | ICD-10-CM | POA: Insufficient documentation

## 2022-01-05 MED ORDER — ROPINIROLE HCL 0.25 MG PO TABS: 0.2500 mg | ORAL_TABLET | Freq: Every day | ORAL | 0 refills | Status: DC

## 2022-01-05 NOTE — Assessment & Plan Note (Signed)
New diagnosis.  Will start Mirtazipine 0.25mg  at night.  Discussed side effects and benefits of medication with patient during visit.  Labs ordered today. Will make recommendations based on lab results.  Follow up in 1 month for reevaluation.  Can increase dose of Mirtazipine at that time if necessary.  May need referral to Neurology if symptoms do not improve. ?

## 2022-01-05 NOTE — Assessment & Plan Note (Signed)
Ongoing. Not improved after RMSF treatment and starting Vitamin B12 and Vitamin D.  Will order labs to recheck levels.  ?

## 2022-01-06 LAB — VITAMIN D 25 HYDROXY (VIT D DEFICIENCY, FRACTURES): Vit D, 25-Hydroxy: 24.3 ng/mL — ABNORMAL LOW (ref 30.0–100.0)

## 2022-01-06 LAB — VITAMIN B12: Vitamin B-12: 1290 pg/mL — ABNORMAL HIGH (ref 232–1245)

## 2022-01-06 NOTE — Progress Notes (Signed)
Hi Gabriel Gibson. Your Vitamin D is improved from prior but still low.  Continue with the vitmain D supplement.   Your B12 is elevated.  You can stop the B12 supplement.  I will see you at our next visit.

## 2022-01-21 ENCOUNTER — Encounter: Payer: Self-pay | Admitting: Nurse Practitioner

## 2022-01-21 MED ORDER — ROPINIROLE HCL 0.5 MG PO TABS: 0.5000 mg | ORAL_TABLET | Freq: Every day | ORAL | 0 refills | Status: DC

## 2022-01-21 NOTE — Progress Notes (Signed)
Medication do increased. ?

## 2022-02-03 NOTE — Progress Notes (Signed)
BP 135/80   Pulse 70   Temp 98.4 F (36.9 C) (Oral)   Wt 187 lb 9.6 oz (85.1 kg)   SpO2 98%   BMI 26.23 kg/m    Subjective:    Patient ID: Gabriel Gibson, male    DOB: 07-24-2001, 21 y.o.   MRN: [id number]  HPI: Gabriel Gibson is a 21 y.o. male  Chief Complaint  Patient presents with   Follow-up    1 month f/up for RLS - patient states medication is working a little bit    Patient states he thinks the medication is helping a little bit.   Patient states he is sleeping better at night.  He feels like the jumping muscles in his legs are not as bad.    RESTLESS LEGS Duration: months Discomfort description:   twitching, burning, and cramping Pain: no Location: calves and sometimes in his quads Bilateral: yes Symmetric: yes Severity: moderate Onset:  sudden Frequency:  constant Symptoms only occur while legs at rest: yes Sudden unintentional leg jerking:  sometimes Bed partner bothered by leg movements: no LE numbness: yes Decreased sensation: no Weakness: yes Insomnia: yes Daytime somnolence: no Fatigue: yes Alleviating factors:  Aggravating factors:  Status: stable  ACNE Patient states he has been having acne on his back and chest.  He saw Dermatology who recommended acutane but he doesn't want to do that.     Relevant past medical, surgical, family and social history reviewed and updated as indicated. Interim medical history since our last visit reviewed. Allergies and medications reviewed and updated.  Review of Systems  Musculoskeletal:  Positive for myalgias.       Muscle twitching  Skin:        acne   Per HPI unless specifically indicated above     Objective:    BP 135/80   Pulse 70   Temp 98.4 F (36.9 C) (Oral)   Wt 187 lb 9.6 oz (85.1 kg)   SpO2 98%   BMI 26.23 kg/m   Wt Readings from Last 3 Encounters:  02/04/22 187 lb 9.6 oz (85.1 kg)  01/05/22 187 lb 3.2 oz (84.9 kg)  10/29/21 188 lb 12.8 oz (85.6 kg)    Physical Exam Vitals and  nursing note reviewed.  Constitutional:      General: He is not in acute distress.    Appearance: Normal appearance. He is not ill-appearing, toxic-appearing or diaphoretic.  HENT:     Head: Normocephalic.     Right Ear: External ear normal.     Left Ear: External ear normal.     Nose: Nose normal. No congestion or rhinorrhea.     Mouth/Throat:     Mouth: Mucous membranes are moist.  Eyes:     General:        Right eye: No discharge.        Left eye: No discharge.     Extraocular Movements: Extraocular movements intact.     Conjunctiva/sclera: Conjunctivae normal.     Pupils: Pupils are equal, round, and reactive to light.  Cardiovascular:     Rate and Rhythm: Normal rate and regular rhythm.     Heart sounds: No murmur heard. Pulmonary:     Effort: Pulmonary effort is normal. No respiratory distress.     Breath sounds: Normal breath sounds. No wheezing, rhonchi or rales.  Abdominal:     General: Abdomen is flat. Bowel sounds are normal.  Musculoskeletal:     Cervical back: Normal range  of motion and neck supple.  Skin:    General: Skin is warm and dry.     Capillary Refill: Capillary refill takes less than 2 seconds.       Neurological:     General: No focal deficit present.     Mental Status: He is alert and oriented to person, place, and time.  Psychiatric:        Mood and Affect: Mood normal.        Behavior: Behavior normal.        Thought Content: Thought content normal.        Judgment: Judgment normal.    Results for orders placed or performed in visit on 01/05/22  Vitamin D (25 hydroxy)  Result Value Ref Range   Vit D, 25-Hydroxy 24.3 (L) 30.0 - 100.0 ng/mL  B12  Result Value Ref Range   Vitamin B-12 1,290 (H) 232 - 1,245 pg/mL      Assessment & Plan:   Problem List Items Addressed This Visit       Other   RLS (restless legs syndrome) - Primary    Chronic.  Improved. Will increase requip to 1mg  daily.  Referral placed for patient to see Neurology.   Return to clinic in 3 months for reevaluation.  Call sooner if concerns arise.         Relevant Orders   Ambulatory referral to Neurology     Follow up plan: Return in about 3 months (around 05/07/2022) for Medication Management.

## 2022-02-04 ENCOUNTER — Encounter: Payer: Self-pay | Admitting: Nurse Practitioner

## 2022-02-04 ENCOUNTER — Ambulatory Visit (INDEPENDENT_AMBULATORY_CARE_PROVIDER_SITE_OTHER): Payer: 59 | Admitting: Nurse Practitioner

## 2022-02-04 VITALS — BP 135/80 | HR 70 | Temp 98.4°F | Wt 187.6 lb

## 2022-02-04 DIAGNOSIS — G2581 Restless legs syndrome: Secondary | ICD-10-CM | POA: Diagnosis not present

## 2022-02-04 MED ORDER — ROPINIROLE HCL 1 MG PO TABS: 1.0000 mg | ORAL_TABLET | Freq: Every day | ORAL | 1 refills | Status: DC

## 2022-02-04 MED ORDER — ADAPALENE 0.1 % EX CREA: TOPICAL_CREAM | Freq: Every day | CUTANEOUS | 1 refills | Status: DC

## 2022-02-04 NOTE — Assessment & Plan Note (Signed)
Chronic.  Improved. Will increase requip to 1mg  daily.  Referral placed for patient to see Neurology.  Return to clinic in 3 months for reevaluation.  Call sooner if concerns arise.

## 2022-02-07 ENCOUNTER — Telehealth: Payer: Self-pay

## 2022-02-07 NOTE — Telephone Encounter (Signed)
PA has been initiated for Adapelene 0.1% cream through covermymeds. Awaiting determination.  Key: [id number]

## 2022-02-07 NOTE — Telephone Encounter (Signed)
PA for Adapalene has been denied. It is not covered because it is not on the listing or formulary of approved drugs and will only cover if pt has had inadequate response to generic retin-A or OTC Differin gel. Please advise.

## 2022-02-07 NOTE — Telephone Encounter (Signed)
Pt aware. Voiced understanding, no further questions at this time.

## 2022-02-07 NOTE — Telephone Encounter (Signed)
Please let patient know his insurance denied the cream for his acne.  They want him to try over the counter Differin Gel first.  This can be found at [facility], [facility], [facility], etc.

## 2022-02-10 ENCOUNTER — Other Ambulatory Visit: Payer: Self-pay | Admitting: Nurse Practitioner

## 2022-02-11 NOTE — Telephone Encounter (Signed)
Pt is now taking 1mg  dosage. Requested Prescriptions  Pending Prescriptions Disp Refills  . rOPINIRole (REQUIP) 0.25 MG tablet [Pharmacy Med Name: ROPINIROLE 0.25MG  TABLETS] 30 tablet 0    Sig: TAKE 1 TABLET(0.25 MG) BY MOUTH AT BEDTIME     Neurology:  Parkinsonian Agents Passed - 02/10/2022 10:09 AM      Passed - Last BP in normal range    BP Readings from Last 1 Encounters:  02/04/22 135/80         Passed - Last Heart Rate in normal range    Pulse Readings from Last 1 Encounters:  02/04/22 70         Passed - Valid encounter within last 12 months    Recent Outpatient Visits          1 week ago RLS (restless legs syndrome)   [place], NP   1 month ago RLS (restless legs syndrome)   [facility] Jon Billings, NP   3 months ago [facility] spotted fever   [facility] Jon Billings, NP   3 months ago Palpitations   [facility] Jon Billings, NP      Future Appointments            In 2 months Jon Billings, NP [facility], [place]

## 2022-02-27 ENCOUNTER — Other Ambulatory Visit: Payer: Self-pay | Admitting: Nurse Practitioner

## 2022-02-28 NOTE — Telephone Encounter (Signed)
Refilled 02/04/2022 #90 1 refill - a 6 month supply. Receipt confirmed by [facility] [phone]. Requested Prescriptions  Pending Prescriptions Disp Refills  . rOPINIRole (REQUIP) 0.5 MG tablet [Pharmacy Med Name: ROPINIROLE 0.5MG  TABLETS] 30 tablet 0    Sig: TAKE 1 TABLET(0.5 MG) BY MOUTH AT BEDTIME     Neurology:  Parkinsonian Agents Passed - 02/27/2022  1:44 PM      Passed - Last BP in normal range    BP Readings from Last 1 Encounters:  02/04/22 135/80         Passed - Last Heart Rate in normal range    Pulse Readings from Last 1 Encounters:  02/04/22 70         Passed - Valid encounter within last 12 months    Recent Outpatient Visits          3 weeks ago RLS (restless legs syndrome)   [facility] Larae Grooms, NP   1 month ago RLS (restless legs syndrome)   [facility] Larae Grooms, NP   4 months ago [facility] spotted fever   [facility] Larae Grooms, NP   4 months ago Palpitations   [facility] Larae Grooms, NP      Future Appointments            In 2 months Larae Grooms, NP [facility]

## 2022-03-03 ENCOUNTER — Encounter: Payer: Self-pay | Admitting: Nurse Practitioner

## 2022-03-04 ENCOUNTER — Ambulatory Visit: Payer: 59 | Admitting: Family Medicine

## 2022-03-04 ENCOUNTER — Encounter: Payer: Self-pay | Admitting: Family Medicine

## 2022-03-04 VITALS — BP 138/80 | HR 66 | Temp 98.3°F | Wt 186.6 lb

## 2022-03-04 DIAGNOSIS — H60331 Swimmer's ear, right ear: Secondary | ICD-10-CM | POA: Diagnosis not present

## 2022-03-04 MED ORDER — CIPROFLOXACIN-DEXAMETHASONE 0.3-0.1 % OT SUSP: 4.0000 [drp] | Freq: Two times a day (BID) | OTIC | 0 refills | Status: DC

## 2022-03-04 NOTE — Telephone Encounter (Signed)
LVM asking patient to call back to offer him this appointment slot

## 2022-03-04 NOTE — Telephone Encounter (Signed)
Pt scheduled  

## 2022-03-04 NOTE — Progress Notes (Signed)
BP 138/80   Pulse 66   Temp 98.3 F (36.8 C) (Oral)   Wt 186 lb 9.6 oz (84.6 kg)   SpO2 99%   BMI 26.09 kg/m    Subjective:    Patient ID: Gabriel Gibson, male    DOB: 04-16-2001, 21 y.o.   MRN: [id number]  HPI: Gabriel Gibson is a 21 y.o. male  Chief Complaint  Patient presents with   Otalgia     X 5 days R ear pain.    EAR PAIN Duration: 5 days Involved ear(s): right Severity:  severe  Quality:  aching and sore Fever: no Otorrhea: yes Upper respiratory infection symptoms: no Pruritus: no Hearing loss: yes Water immersion no Using Q-tips: no Recurrent otitis media: no Status: worse Treatments attempted: pseudoephedrine   Relevant past medical, surgical, family and social history reviewed and updated as indicated. Interim medical history since our last visit reviewed. Allergies and medications reviewed and updated.  Review of Systems  Constitutional: Negative.   HENT:  Positive for ear discharge and ear pain. Negative for congestion, dental problem, drooling, facial swelling, hearing loss, mouth sores, nosebleeds, postnasal drip, rhinorrhea, sinus pressure, sinus pain, sneezing, sore throat, tinnitus, trouble swallowing and voice change.   Respiratory: Negative.    Cardiovascular: Negative.   Gastrointestinal: Negative.   Psychiatric/Behavioral: Negative.      Per HPI unless specifically indicated above     Objective:    BP 138/80   Pulse 66   Temp 98.3 F (36.8 C) (Oral)   Wt 186 lb 9.6 oz (84.6 kg)   SpO2 99%   BMI 26.09 kg/m   Wt Readings from Last 3 Encounters:  03/04/22 186 lb 9.6 oz (84.6 kg)  02/04/22 187 lb 9.6 oz (85.1 kg)  01/05/22 187 lb 3.2 oz (84.9 kg)    Physical Exam Vitals and nursing note reviewed.  Constitutional:      General: He is not in acute distress.    Appearance: Normal appearance. He is normal weight. He is not ill-appearing, toxic-appearing or diaphoretic.  HENT:     Head: Normocephalic and atraumatic.     Right  Ear: Tympanic membrane and external ear normal.     Left Ear: Tympanic membrane, ear canal and external ear normal.     Ears:     Comments: Swollen and pus filled R EAC, TM normal    Nose: Nose normal. No congestion or rhinorrhea.     Mouth/Throat:     Mouth: Mucous membranes are moist.     Pharynx: Oropharynx is clear. No oropharyngeal exudate or posterior oropharyngeal erythema.  Eyes:     General: No scleral icterus.       Right eye: No discharge.        Left eye: No discharge.     Extraocular Movements: Extraocular movements intact.     Conjunctiva/sclera: Conjunctivae normal.     Pupils: Pupils are equal, round, and reactive to light.  Cardiovascular:     Rate and Rhythm: Normal rate and regular rhythm.     Pulses: Normal pulses.     Heart sounds: Normal heart sounds. No murmur heard.    No friction rub. No gallop.  Pulmonary:     Effort: Pulmonary effort is normal. No respiratory distress.     Breath sounds: Normal breath sounds. No stridor. No wheezing, rhonchi or rales.  Chest:     Chest wall: No tenderness.  Musculoskeletal:        General: Normal range  of motion.     Cervical back: Normal range of motion and neck supple.  Skin:    General: Skin is warm and dry.     Capillary Refill: Capillary refill takes less than 2 seconds.     Coloration: Skin is not jaundiced or pale.     Findings: No bruising, erythema, lesion or rash.  Neurological:     General: No focal deficit present.     Mental Status: He is alert and oriented to person, place, and time. Mental status is at baseline.  Psychiatric:        Mood and Affect: Mood normal.        Behavior: Behavior normal.        Thought Content: Thought content normal.        Judgment: Judgment normal.     Results for orders placed or performed in visit on 01/05/22  Vitamin D (25 hydroxy)  Result Value Ref Range   Vit D, 25-Hydroxy 24.3 (L) 30.0 - 100.0 ng/mL  B12  Result Value Ref Range   Vitamin B-12 1,290 (H) 232 -  1,245 pg/mL      Assessment & Plan:   Problem List Items Addressed This Visit   None Visit Diagnoses     Acute swimmer's ear of right side    -  Primary   Will treat with ciprodex. Call with any concerns or if not getting better.         Follow up plan: Return if symptoms worsen or fail to improve.

## 2022-03-04 NOTE — Telephone Encounter (Signed)
I can see hi at 3:20 OK to double book

## 2022-03-21 ENCOUNTER — Ambulatory Visit: Payer: 59 | Admitting: Nurse Practitioner

## 2022-03-21 ENCOUNTER — Encounter: Payer: Self-pay | Admitting: Nurse Practitioner

## 2022-03-21 VITALS — BP 137/89 | HR 76 | Temp 98.6°F | Wt 183.0 lb

## 2022-03-21 DIAGNOSIS — G2581 Restless legs syndrome: Secondary | ICD-10-CM | POA: Diagnosis not present

## 2022-03-21 NOTE — Patient Instructions (Signed)
Referral sent to [facility] [address] [place], [place] [address]     Office [phone]

## 2022-03-21 NOTE — Progress Notes (Deleted)
   There were no vitals taken for this visit.   Subjective:    Patient ID: Gabriel Gibson, male    DOB: 01-18-2001, 21 y.o.   MRN: [id number]  HPI: Gabriel Gibson is a 21 y.o. male  No chief complaint on file.   Relevant past medical, surgical, family and social history reviewed and updated as indicated. Interim medical history since our last visit reviewed. Allergies and medications reviewed and updated.  Review of Systems  Per HPI unless specifically indicated above     Objective:    There were no vitals taken for this visit.  Wt Readings from Last 3 Encounters:  03/04/22 186 lb 9.6 oz (84.6 kg)  02/04/22 187 lb 9.6 oz (85.1 kg)  01/05/22 187 lb 3.2 oz (84.9 kg)    Physical Exam  Results for orders placed or performed in visit on 01/05/22  Vitamin D (25 hydroxy)  Result Value Ref Range   Vit D, 25-Hydroxy 24.3 (L) 30.0 - 100.0 ng/mL  B12  Result Value Ref Range   Vitamin B-12 1,290 (H) 232 - 1,245 pg/mL      Assessment & Plan:   Problem List Items Addressed This Visit   None    Follow up plan: No follow-ups on file.

## 2022-03-21 NOTE — Progress Notes (Signed)
BP 137/89   Pulse 76   Temp 98.6 F (37 C) (Oral)   Wt 183 lb (83 kg)   SpO2 98%   BMI 25.59 kg/m    Subjective:    Patient ID: Gabriel Gibson, male    DOB: [birth year], 21 y.o.   MRN: [id number]  HPI: GAUGE WINSKI is a 21 y.o. male  Chief Complaint  Patient presents with   Leg Problem    RLS- states medication is not working for him, here to discuss different options    Patient states he thinks the medication is no longer helping with his symptoms.   RESTLESS LEGS Patient states the requip was helping a little but but then it stopped.  Does not feel any relief at this time with the requip. Duration: months Discomfort description:   twitching, burning, and cramping Pain: no Location: calves and sometimes in his quads Bilateral: yes Symmetric: yes Severity: moderate Onset:  sudden Frequency:  constant Symptoms only occur while legs at rest: yes Sudden unintentional leg jerking:  sometimes Bed partner bothered by leg movements: no LE numbness: yes Decreased sensation: no Weakness: yes Insomnia: yes Daytime somnolence: no Fatigue: yes Alleviating factors:  Aggravating factors:  Status: stable     Relevant past medical, surgical, family and social history reviewed and updated as indicated. Interim medical history since our last visit reviewed. Allergies and medications reviewed and updated.  Review of Systems  Musculoskeletal:  Positive for myalgias.       Muscle twitching  Skin:        acne    Per HPI unless specifically indicated above     Objective:    BP 137/89   Pulse 76   Temp 98.6 F (37 C) (Oral)   Wt 183 lb (83 kg)   SpO2 98%   BMI 25.59 kg/m   Wt Readings from Last 3 Encounters:  03/21/22 183 lb (83 kg)  03/04/22 186 lb 9.6 oz (84.6 kg)  02/04/22 187 lb 9.6 oz (85.1 kg)    Physical Exam Vitals and nursing note reviewed.  Constitutional:      General: He is not in acute distress.    Appearance: Normal appearance. He is not  ill-appearing, toxic-appearing or diaphoretic.  HENT:     Head: Normocephalic.     Right Ear: External ear normal.     Left Ear: External ear normal.     Nose: Nose normal. No congestion or rhinorrhea.     Mouth/Throat:     Mouth: Mucous membranes are moist.  Eyes:     General:        Right eye: No discharge.        Left eye: No discharge.     Extraocular Movements: Extraocular movements intact.     Conjunctiva/sclera: Conjunctivae normal.     Pupils: Pupils are equal, round, and reactive to light.  Cardiovascular:     Rate and Rhythm: Normal rate and regular rhythm.     Heart sounds: No murmur heard. Pulmonary:     Effort: Pulmonary effort is normal. No respiratory distress.     Breath sounds: Normal breath sounds. No wheezing, rhonchi or rales.  Abdominal:     General: Abdomen is flat. Bowel sounds are normal.  Musculoskeletal:     Cervical back: Normal range of motion and neck supple.  Skin:    General: Skin is warm and dry.     Capillary Refill: Capillary refill takes less than 2 seconds.  Neurological:     General: No focal deficit present.     Mental Status: He is alert and oriented to person, place, and time.  Psychiatric:        Mood and Affect: Mood normal.        Behavior: Behavior normal.        Thought Content: Thought content normal.        Judgment: Judgment normal.     Results for orders placed or performed in visit on 01/05/22  Vitamin D (25 hydroxy)  Result Value Ref Range   Vit D, 25-Hydroxy 24.3 (L) 30.0 - 100.0 ng/mL  B12  Result Value Ref Range   Vitamin B-12 1,290 (H) 232 - 1,245 pg/mL      Assessment & Plan:   Problem List Items Addressed This Visit       Other   RLS (restless legs syndrome) - Primary    Chronic. Not improved with Requip.  Discussed referral to neurology.  Phone number and information given for patient to call and make appointment with Neurology.  Follow up after neurology appointment.        Follow up  plan: Return if symptoms worsen or fail to improve.

## 2022-03-21 NOTE — Assessment & Plan Note (Signed)
Chronic. Not improved with Requip.  Discussed referral to neurology.  Phone number and information given for patient to call and make appointment with Neurology.  Follow up after neurology appointment.

## 2022-04-07 ENCOUNTER — Ambulatory Visit: Payer: 59 | Admitting: Nurse Practitioner

## 2022-05-09 ENCOUNTER — Ambulatory Visit: Payer: 59 | Admitting: Nurse Practitioner

## 2022-05-09 NOTE — Progress Notes (Deleted)
There were no vitals taken for this visit.   Subjective:    Patient ID: Gabriel Gibson, male    DOB: [birth year], 21 y.o.   MRN: [id number]  HPI: Gabriel Gibson is a 21 y.o. male  No chief complaint on file.  Patient states he thinks the medication is no longer helping with his symptoms.   RESTLESS LEGS Patient states the requip was helping a little but but then it stopped.  Does not feel any relief at this time with the requip. Duration: months Discomfort description:   twitching, burning, and cramping Pain: no Location: calves and sometimes in his quads Bilateral: yes Symmetric: yes Severity: moderate Onset:  sudden Frequency:  constant Symptoms only occur while legs at rest: yes Sudden unintentional leg jerking:  sometimes Bed partner bothered by leg movements: no LE numbness: yes Decreased sensation: no Weakness: yes Insomnia: yes Daytime somnolence: no Fatigue: yes Alleviating factors:  Aggravating factors:  Status: stable     Relevant past medical, surgical, family and social history reviewed and updated as indicated. Interim medical history since our last visit reviewed. Allergies and medications reviewed and updated.  Review of Systems  Musculoskeletal:  Positive for myalgias.       Muscle twitching  Skin:        acne    Per HPI unless specifically indicated above     Objective:    There were no vitals taken for this visit.  Wt Readings from Last 3 Encounters:  03/21/22 183 lb (83 kg)  03/04/22 186 lb 9.6 oz (84.6 kg)  02/04/22 187 lb 9.6 oz (85.1 kg)    Physical Exam Vitals and nursing note reviewed.  Constitutional:      General: He is not in acute distress.    Appearance: Normal appearance. He is not ill-appearing, toxic-appearing or diaphoretic.  HENT:     Head: Normocephalic.     Right Ear: External ear normal.     Left Ear: External ear normal.     Nose: Nose normal. No congestion or rhinorrhea.     Mouth/Throat:     Mouth: Mucous  membranes are moist.  Eyes:     General:        Right eye: No discharge.        Left eye: No discharge.     Extraocular Movements: Extraocular movements intact.     Conjunctiva/sclera: Conjunctivae normal.     Pupils: Pupils are equal, round, and reactive to light.  Cardiovascular:     Rate and Rhythm: Normal rate and regular rhythm.     Heart sounds: No murmur heard. Pulmonary:     Effort: Pulmonary effort is normal. No respiratory distress.     Breath sounds: Normal breath sounds. No wheezing, rhonchi or rales.  Abdominal:     General: Abdomen is flat. Bowel sounds are normal.  Musculoskeletal:     Cervical back: Normal range of motion and neck supple.  Skin:    General: Skin is warm and dry.     Capillary Refill: Capillary refill takes less than 2 seconds.       Neurological:     General: No focal deficit present.     Mental Status: He is alert and oriented to person, place, and time.  Psychiatric:        Mood and Affect: Mood normal.        Behavior: Behavior normal.        Thought Content: Thought content normal.  Judgment: Judgment normal.    Results for orders placed or performed in visit on 01/05/22  Vitamin D (25 hydroxy)  Result Value Ref Range   Vit D, 25-Hydroxy 24.3 (L) 30.0 - 100.0 ng/mL  B12  Result Value Ref Range   Vitamin B-12 1,290 (H) 232 - 1,245 pg/mL      Assessment & Plan:   Problem List Items Addressed This Visit   None    Follow up plan: No follow-ups on file.

## 2022-07-15 ENCOUNTER — Ambulatory Visit
Admission: EM | Admit: 2022-07-15 | Discharge: 2022-07-15 | Disposition: A | Discharge status: Home / Self Care | Payer: 59

## 2022-07-15 DIAGNOSIS — H66001 Acute suppurative otitis media without spontaneous rupture of ear drum, right ear: Secondary | ICD-10-CM | POA: Diagnosis not present

## 2022-07-15 MED ORDER — FLUTICASONE PROPIONATE 50 MCG/ACT NA SUSP: 1.0000 | Freq: Two times a day (BID) | NASAL | 0 refills | Status: DC

## 2022-07-15 MED ORDER — AMOXICILLIN-POT CLAVULANATE 875-125 MG PO TABS: 1.0000 | ORAL_TABLET | Freq: Two times a day (BID) | ORAL | 0 refills | Status: AC

## 2022-07-15 NOTE — ED Triage Notes (Signed)
Pt. Presents to UC w/ c/o right ear pain and fullness for the past week.

## 2022-07-15 NOTE — Discharge Instructions (Addendum)
Follow up with your primary care or ENT provider if your symptoms are worsening or not improving with treatment.    

## 2022-07-15 NOTE — ED Provider Notes (Signed)
Gabriel Gibson    CSN: [id number] Arrival date & time: 07/15/22  1633      History   Chief Complaint No chief complaint on file.   HPI Gabriel Gibson is a 21 y.o. male.   HPI  Presents to UC with complaint of right ear pain, fullness loss of hearing x1 week.  He reports having ear infection about a month ago which was treated with drops.  Past Medical History:  Diagnosis Date   Asthma     Patient Active Problem List   Diagnosis Date Noted   RLS (restless legs syndrome) 01/05/2022   Subjective weakness     No past surgical history on file.     Home Medications    Prior to Admission medications   Medication Sig Start Date End Date Taking? Authorizing Provider  adapalene (DIFFERIN) 0.1 % cream Apply topically at bedtime. 02/04/22   Jon Billings, NP  cholecalciferol (VITAMIN D3) 25 MCG (1000 UNIT) tablet Take 1 tablet (1,000 Units total) by mouth daily. 10/29/21   Jon Billings, NP  ciprofloxacin-dexamethasone (CIPRODEX) OTIC suspension Place 4 drops into the right ear 2 (two) times daily. 03/04/22   Johnson, Megan P, DO  ibuprofen (ADVIL) 200 MG tablet Take 800 mg by mouth every 6 (six) hours as needed for headache or moderate pain.    [provider]  rOPINIRole (REQUIP) 1 MG tablet Take 1 tablet (1 mg total) by mouth at bedtime. 02/04/22   Jon Billings, NP    Family History Family History  Problem Relation Age of Onset   Other Mother        unknown medical history   Healthy Father     Social History Social History   Tobacco Use   Smoking status: Every Day    Types: E-cigarettes   Smokeless tobacco: Never  Vaping Use   Vaping Use: Every day   Substances: Nicotine, Flavoring  Substance Use Topics   Alcohol use: Yes    Comment: occassional   Drug use: Never     Allergies   Patient has no known allergies.   Review of Systems Review of Systems   Physical Exam Triage Vital Signs ED Triage Vitals  Enc Vitals Group      BP      Pulse      Resp      Temp      Temp src      SpO2      Weight      Height      Head Circumference      Peak Flow      Pain Score      Pain Loc      Pain Edu?      Excl. in [place]?    No data found.  Updated Vital Signs There were no vitals taken for this visit.  Visual Acuity Right Eye Distance:   Left Eye Distance:   Bilateral Distance:    Right Eye Near:   Left Eye Near:    Bilateral Near:     Physical Exam Vitals reviewed.  Constitutional:      Appearance: Normal appearance.  Skin:    General: Skin is warm and dry.  Neurological:     General: No focal deficit present.     Mental Status: He is alert and oriented to person, place, and time.  Psychiatric:        Mood and Affect: Mood normal.  Behavior: Behavior normal.      UC Treatments / Results  Labs (all labs ordered are listed, but only abnormal results are displayed) Labs Reviewed - No data to display  EKG   Radiology No results found.  Procedures Procedures (including critical care time)  Medications Ordered in UC Medications - No data to display  Initial Impression / Assessment and Plan / UC Course  I have reviewed the triage vital signs and the nursing notes.  Pertinent labs & imaging results that were available during my care of the patient were reviewed by me and considered in my medical decision making (see chart for details).   Right TM is cloudy versus suppurative behind the TM while treating with flonase. Will attempt treatment with abx then refer him to ENT for additional evaluation.   Final Clinical Impressions(s) / UC Diagnoses   Final diagnoses:  None   Discharge Instructions   None    ED Prescriptions   None    PDMP not reviewed this encounter.   Rose Phi, [place] 07/15/22 1655

## 2022-07-19 ENCOUNTER — Ambulatory Visit: Payer: 59 | Admitting: Family Medicine

## 2022-08-04 ENCOUNTER — Ambulatory Visit: Payer: 59 | Admitting: Physician Assistant

## 2022-08-04 ENCOUNTER — Encounter: Payer: Self-pay | Admitting: Physician Assistant

## 2022-08-04 VITALS — BP 124/73 | HR 66 | Temp 99.0°F | Wt 189.3 lb

## 2022-08-04 DIAGNOSIS — H6060 Unspecified chronic otitis externa, unspecified ear: Secondary | ICD-10-CM | POA: Insufficient documentation

## 2022-08-04 DIAGNOSIS — H60501 Unspecified acute noninfective otitis externa, right ear: Secondary | ICD-10-CM | POA: Diagnosis not present

## 2022-08-04 MED ORDER — NEOMYCIN-POLYMYXIN-HC 3.5-10000-1 OT SOLN
4.0000 [drp] | Freq: Four times a day (QID) | OTIC | 0 refills | Status: AC
Start: 2022-08-04 — End: 2022-08-11

## 2022-08-04 NOTE — Progress Notes (Signed)
Acute Office Visit   Patient: Gabriel Gibson   DOB: 01/09/2001   21 y.o. Male  MRN: [id number] Visit Date: 08/04/2022  Today's healthcare provider: Oswaldo Conroy Pearly Apachito, PA-C  Introduced myself to the patient as a Secondary school teacher and provided education on APPs in clinical practice.    Chief Complaint  Patient presents with   Ear Pain    Pt states he has been having R ear pain for the past month and a half. States he has been on an antibiotic and ear drops, pain comes back. Patient states he feels like his ear is full of fluid.   Subjective    HPI HPI     Ear Pain    Additional comments: Pt states he has been having R ear pain for the past month and a half. States he has been on an antibiotic and ear drops, pain comes back. Patient states he feels like his ear is full of fluid.      Last edited by Pablo Ledger, CMA on 08/04/2022  3:50 PM.         Reports his right ear feels like there is fluid in it  Has been ongoing for 4-5 weeks  He was [facility] this on 07/15/22 and was given abx drops and nasal spray  He is used the nasal spray for about 2 weeks then stopped He completed 7 day course of Abx otic ear drops without much improvement He denies pain in his ear but reports ringing sensation often       Medications: Outpatient Medications Prior to Visit  Medication Sig   ibuprofen (ADVIL) 200 MG tablet Take 800 mg by mouth every 6 (six) hours as needed for headache or moderate pain.   [DISCONTINUED] adapalene (DIFFERIN) 0.1 % cream Apply topically at bedtime.   [DISCONTINUED] cholecalciferol (VITAMIN D3) 25 MCG (1000 UNIT) tablet Take 1 tablet (1,000 Units total) by mouth daily.   [DISCONTINUED] ciprofloxacin-dexamethasone (CIPRODEX) OTIC suspension Place 4 drops into the right ear 2 (two) times daily.   [DISCONTINUED] fluticasone (FLONASE) 50 MCG/ACT nasal spray Place 1 spray into both nostrils in the morning and at bedtime.   [DISCONTINUED] gabapentin (NEURONTIN) 100  MG capsule Take by mouth.   [DISCONTINUED] rOPINIRole (REQUIP) 1 MG tablet Take 1 tablet (1 mg total) by mouth at bedtime.   No facility-administered medications prior to visit.    Review of Systems  HENT:  Positive for ear discharge and ear pain.        Objective    BP 124/73   Pulse 66   Temp 99 F (37.2 C) (Oral)   Wt 189 lb 4.8 oz (85.9 kg)   SpO2 98%   BMI 26.47 kg/m    Physical Exam Vitals reviewed.  Constitutional:      General: He is awake.     Appearance: Normal appearance. He is well-developed and well-groomed.  HENT:     Head: Normocephalic and atraumatic.     Right Ear: Drainage and swelling present. No mastoid tenderness.     Left Ear: No drainage, swelling or tenderness. Tympanic membrane is not injected, scarred, perforated, erythematous, retracted or bulging.     Ears:     Comments: Unable to fully visualize the right TM due to drainage and discharge in canal  Canal is mildly erythematous with debris along borders  Pulmonary:     Effort: Pulmonary effort is normal.  Lymphadenopathy:  Head:     Right side of head: No submental, submandibular, preauricular or posterior auricular adenopathy.     Left side of head: No submental, submandibular, preauricular or posterior auricular adenopathy.  Neurological:     Mental Status: He is alert.  Psychiatric:        Behavior: Behavior is cooperative.       No results found for any visits on 08/04/22.  Assessment & Plan      No follow-ups on file.     Problem List Items Addressed This Visit       Nervous and Auditory   Acute otitis externa of right ear - Primary    Acute, recurrent and ongoing Reports drainage and ringing sensation of right ear for the past few weeks that has not improved after going to UC He was treated with Ciprodex there and Flonase  Unable to fully visualize the right TM due to debris and drainage so Cortisporin is recommended at this time and failure of Ciprodex to improve  symptoms necessitates alternative further Will start neomycin-polymyxin-hydrocortisone otic solution today- recommend 4 drops in right ear, four times per day for 7 days Can continue with Flonase- recommend continued administration for at least 4 weeks to notice improvement in symptoms Follow up as needed for persistent or progressing symptoms       Relevant Medications   neomycin-polymyxin-hydrocortisone (CORTISPORIN) OTIC solution     No follow-ups on file.   I, Ezariah Nace E Kyron Schlitt, PA-C, have reviewed all documentation for this visit. The documentation on 08/04/22 for the exam, diagnosis, procedures, and orders are all accurate and complete.   Jacquelin Hawking, MHS, PA-C [facility]

## 2022-08-04 NOTE — Assessment & Plan Note (Signed)
Acute, recurrent and ongoing Reports drainage and ringing sensation of right ear for the past few weeks that has not improved after going to UC He was treated with Ciprodex there and Flonase  Unable to fully visualize the right TM due to debris and drainage so Cortisporin is recommended at this time and failure of Ciprodex to improve symptoms necessitates alternative further Will start neomycin-polymyxin-hydrocortisone otic solution today- recommend 4 drops in right ear, four times per day for 7 days Can continue with Flonase- recommend continued administration for at least 4 weeks to notice improvement in symptoms Follow up as needed for persistent or progressing symptoms

## 2022-08-19 ENCOUNTER — Encounter: Payer: Self-pay | Admitting: Family Medicine

## 2022-08-19 ENCOUNTER — Ambulatory Visit: Payer: 59 | Admitting: Family Medicine

## 2022-08-19 VITALS — BP 123/78 | HR 66 | Temp 98.4°F | Wt 189.1 lb

## 2022-08-19 DIAGNOSIS — H60331 Swimmer's ear, right ear: Secondary | ICD-10-CM | POA: Diagnosis not present

## 2022-08-19 MED ORDER — CIPROFLOXACIN-DEXAMETHASONE 0.3-0.1 % OT SUSP
4.0000 [drp] | Freq: Two times a day (BID) | OTIC | 0 refills | Status: DC
Start: 2022-08-19 — End: 2022-08-19

## 2022-08-19 MED ORDER — CIPROFLOXACIN-DEXAMETHASONE 0.3-0.1 % OT SUSP: 4.0000 [drp] | Freq: Two times a day (BID) | OTIC | 0 refills | Status: DC

## 2022-08-19 NOTE — Progress Notes (Signed)
BP 123/78   Pulse 66   Temp 98.4 F (36.9 C) (Oral)   Wt 189 lb 1.6 oz (85.8 kg)   SpO2 98%   BMI 26.44 kg/m    Subjective:    Patient ID: Gabriel Gibson, male    DOB: 08-12-2001, 21 y.o.   MRN: [id number]  HPI: Gabriel Gibson is a 21 y.o. male  Chief Complaint  Patient presents with   Otitis Media    Patient says he is still experiencing pain that comes and goes in his R ear after completion of ear drops.   EAR PAIN Duration:  3 months Involved ear(s): right Severity:  moderate  Quality:  aching Fever: no Otorrhea: yes Upper respiratory infection symptoms: no Pruritus: yes Hearing loss: yes Water immersion no Using Q-tips: no Recurrent otitis media: no Status: stable Treatments attempted:  polymyxin  Relevant past medical, surgical, family and social history reviewed and updated as indicated. Interim medical history since our last visit reviewed. Allergies and medications reviewed and updated.  Review of Systems  Constitutional: Negative.   HENT:  Positive for ear discharge and hearing loss. Negative for congestion, dental problem, drooling, ear pain, facial swelling, mouth sores, nosebleeds, postnasal drip, rhinorrhea, sinus pressure, sinus pain, sneezing, sore throat, tinnitus, trouble swallowing and voice change.   Respiratory: Negative.    Cardiovascular: Negative.   Gastrointestinal: Negative.   Psychiatric/Behavioral: Negative.      Per HPI unless specifically indicated above     Objective:    BP 123/78   Pulse 66   Temp 98.4 F (36.9 C) (Oral)   Wt 189 lb 1.6 oz (85.8 kg)   SpO2 98%   BMI 26.44 kg/m   Wt Readings from Last 3 Encounters:  08/19/22 189 lb 1.6 oz (85.8 kg)  08/04/22 189 lb 4.8 oz (85.9 kg)  03/21/22 183 lb (83 kg)    Physical Exam Vitals and nursing note reviewed.  Constitutional:      General: He is not in acute distress.    Appearance: Normal appearance. He is well-developed.  HENT:     Head: Normocephalic and atraumatic.      Comments: Pus and irritation in R EAC    Right Ear: Hearing normal.     Left Ear: Hearing, tympanic membrane and ear canal normal.     Nose: Nose normal.     Mouth/Throat:     Mouth: Mucous membranes are moist.     Pharynx: Oropharynx is clear.  Eyes:     General: Lids are normal. No scleral icterus.       Right eye: No discharge.        Left eye: No discharge.     Conjunctiva/sclera: Conjunctivae normal.  Pulmonary:     Effort: Pulmonary effort is normal. No respiratory distress.  Musculoskeletal:        General: Normal range of motion.  Skin:    Coloration: Skin is not jaundiced or pale.     Findings: No bruising, erythema, lesion or rash.  Neurological:     Mental Status: He is alert. Mental status is at baseline. He is disoriented.  Psychiatric:        Mood and Affect: Mood normal.        Speech: Speech normal.        Behavior: Behavior normal.        Thought Content: Thought content normal.        Judgment: Judgment normal.     Results for orders  placed or performed in visit on 01/05/22  Vitamin D (25 hydroxy)  Result Value Ref Range   Vit D, 25-Hydroxy 24.3 (L) 30.0 - 100.0 ng/mL  B12  Result Value Ref Range   Vitamin B-12 1,290 (H) 232 - 1,245 pg/mL      Assessment & Plan:   Problem List Items Addressed This Visit       Nervous and Auditory   Chronic otitis externa - Primary    Not better after polymxyn- will start ciprodex and recheck 1 week, if not better, will refer to ENT.        Follow up plan: Return in about 1 week (around 08/26/2022).

## 2022-08-19 NOTE — Assessment & Plan Note (Signed)
Not better after polymxyn- will start ciprodex and recheck 1 week, if not better, will refer to ENT.

## 2022-08-22 ENCOUNTER — Ambulatory Visit: Payer: 59 | Admitting: Nurse Practitioner

## 2022-08-30 ENCOUNTER — Encounter: Payer: Self-pay | Admitting: Family Medicine

## 2022-08-30 ENCOUNTER — Ambulatory Visit: Payer: 59 | Admitting: Family Medicine

## 2022-08-30 VITALS — BP 128/78 | HR 73 | Temp 98.3°F | Ht 70.0 in | Wt 192.4 lb

## 2022-08-30 DIAGNOSIS — H60331 Swimmer's ear, right ear: Secondary | ICD-10-CM | POA: Diagnosis not present

## 2022-08-30 NOTE — Progress Notes (Signed)
BP 128/78   Pulse 73   Temp 98.3 F (36.8 C) (Oral)   Ht 5\' 10"  (1.778 m)   Wt 192 lb 6.4 oz (87.3 kg)   SpO2 98%   BMI 27.61 kg/m    Subjective:    Patient ID: Gabriel Gibson, male    DOB: July 26, 2001, 21 y.o.   MRN: [id number]  HPI: Gabriel Gibson is a 21 y.o. male  Chief Complaint  Patient presents with   Ear Pain    Patient says he feels a little better and says he still has the ringing in his R ear.   EAR PAIN Duration: weeks Involved ear(s): right Severity:  mild  Quality:  ringing Fever: no Otorrhea: no Upper respiratory infection symptoms: no Pruritus: no Hearing loss: yes Water immersion no Using Q-tips: no Recurrent otitis media: no Status: better Treatments attempted:  meds  Relevant past medical, surgical, family and social history reviewed and updated as indicated. Interim medical history since our last visit reviewed. Allergies and medications reviewed and updated.  Review of Systems  Constitutional: Negative.   HENT: Negative.    Respiratory: Negative.    Cardiovascular: Negative.   Gastrointestinal: Negative.   Musculoskeletal: Negative.   Psychiatric/Behavioral: Negative.      Per HPI unless specifically indicated above     Objective:    BP 128/78   Pulse 73   Temp 98.3 F (36.8 C) (Oral)   Ht 5\' 10"  (1.778 m)   Wt 192 lb 6.4 oz (87.3 kg)   SpO2 98%   BMI 27.61 kg/m   Wt Readings from Last 3 Encounters:  08/30/22 192 lb 6.4 oz (87.3 kg)  08/19/22 189 lb 1.6 oz (85.8 kg)  08/04/22 189 lb 4.8 oz (85.9 kg)    Physical Exam Vitals and nursing note reviewed.  Constitutional:      General: He is not in acute distress.    Appearance: Normal appearance. He is not ill-appearing, toxic-appearing or diaphoretic.  HENT:     Head: Normocephalic and atraumatic.     Right Ear: Hearing and external ear normal.     Left Ear: Hearing, tympanic membrane and external ear normal.     Ears:     Comments: Cerumen in R EAC    Nose: Nose normal.      Mouth/Throat:     Mouth: Mucous membranes are moist.     Pharynx: Oropharynx is clear.  Eyes:     General: No scleral icterus.       Right eye: No discharge.        Left eye: No discharge.     Extraocular Movements: Extraocular movements intact.     Conjunctiva/sclera: Conjunctivae normal.     Pupils: Pupils are equal, round, and reactive to light.  Cardiovascular:     Rate and Rhythm: Normal rate and regular rhythm.     Pulses: Normal pulses.     Heart sounds: Normal heart sounds. No murmur heard.    No friction rub. No gallop.  Pulmonary:     Effort: Pulmonary effort is normal. No respiratory distress.     Breath sounds: Normal breath sounds. No stridor. No wheezing, rhonchi or rales.  Chest:     Chest wall: No tenderness.  Musculoskeletal:        General: Normal range of motion.     Cervical back: Normal range of motion and neck supple.  Skin:    General: Skin is warm and dry.  Capillary Refill: Capillary refill takes less than 2 seconds.     Coloration: Skin is not jaundiced or pale.     Findings: No bruising, erythema, lesion or rash.  Neurological:     General: No focal deficit present.     Mental Status: He is alert and oriented to person, place, and time. Mental status is at baseline.  Psychiatric:        Mood and Affect: Mood normal.        Behavior: Behavior normal.        Thought Content: Thought content normal.        Judgment: Judgment normal.     Results for orders placed or performed in visit on 01/05/22  Vitamin D (25 hydroxy)  Result Value Ref Range   Vit D, 25-Hydroxy 24.3 (L) 30.0 - 100.0 ng/mL  B12  Result Value Ref Range   Vitamin B-12 1,290 (H) 232 - 1,245 pg/mL      Assessment & Plan:   Problem List Items Addressed This Visit       Nervous and Auditory   Chronic otitis externa - Primary    Resolved. Ear clear. Call with any concerns.         Follow up plan: Return if symptoms worsen or fail to improve.

## 2022-08-30 NOTE — Assessment & Plan Note (Signed)
Resolved. Ear clear. Call with any concerns.

## 2022-08-31 ENCOUNTER — Encounter: Payer: Self-pay | Admitting: Family Medicine

## 2022-09-17 ENCOUNTER — Encounter: Payer: Self-pay | Admitting: Family Medicine

## 2022-10-24 DIAGNOSIS — F411 Generalized anxiety disorder: Secondary | ICD-10-CM | POA: Diagnosis not present

## 2022-10-24 DIAGNOSIS — G2581 Restless legs syndrome: Secondary | ICD-10-CM | POA: Diagnosis not present

## 2022-11-04 ENCOUNTER — Encounter: Payer: Self-pay | Admitting: Physician Assistant

## 2022-11-04 ENCOUNTER — Ambulatory Visit (INDEPENDENT_AMBULATORY_CARE_PROVIDER_SITE_OTHER): Payer: BC Managed Care – PPO | Admitting: Physician Assistant

## 2022-11-04 VITALS — BP 121/72 | HR 76 | Temp 98.4°F | Ht 70.0 in | Wt 180.5 lb

## 2022-11-04 DIAGNOSIS — I999 Unspecified disorder of circulatory system: Secondary | ICD-10-CM | POA: Diagnosis not present

## 2022-11-04 DIAGNOSIS — B3749 Other urogenital candidiasis: Secondary | ICD-10-CM

## 2022-11-04 MED ORDER — NYSTATIN 100000 UNIT/GM EX POWD: 1.0000 | Freq: Three times a day (TID) | CUTANEOUS | 0 refills | Status: DC

## 2022-11-04 NOTE — Progress Notes (Unsigned)
Acute Office Visit   Patient: Gabriel Gibson   DOB: June 30, 2001   22 y.o. Male  MRN: [id number] Visit Date: 11/04/2022  Today's healthcare provider: Dani Gobble Miliani Deike, PA-C  Introduced myself to the patient as a Journalist, newspaper and provided education on APPs in clinical practice.    Chief Complaint  Patient presents with   Skin Problem    Patient says he has an ongoing issues with dryness and redness in his groin area. Patient says he was seen before back in December for this issue and was told to use Vaseline and says that does not help the area. Patient declines any itching in the area.    Subjective    HPI HPI     Skin Problem    Additional comments: Patient says he has an ongoing issues with dryness and redness in his groin area. Patient says he was seen before back in December for this issue and was told to use Vaseline and says that does not help the area. Patient declines any itching in the area.       Last edited by Irena Reichmann, CMA on 11/04/2022  3:35 PM.      Skin Concern Onset: gradual  Duration: several months   Associated symptoms: reports redness and dryness in groin area that is not resolving  Denies area increasing in surface area, bleeding, cracking or drainage  Interventions: has been using Vaseline but this is not improving symptoms     Redness in hands and feet States this has been ongoing for about 2 years  Reports his hands stay red and cool to the touch most of the time  Sometimes they will turn purple or blue and take a while to return to normal  Reports similar redness in feet and toes Reports he sometimes has some tingling in hands but not often He works in [employer] and has to stay active - not outside or exposed to elements He states the redness seems to improve in Summer and warmer weather    Medications: Outpatient Medications Prior to Visit  Medication Sig   ciprofloxacin-dexamethasone (CIPRODEX) OTIC suspension Place 4 drops into the right  ear 2 (two) times daily.   gabapentin (NEURONTIN) 600 MG tablet Take  300 mg twice a day ( 1/2 tab) for one week then increase to 300 mg in the morning and 600 mg at night for one week, then increase 600 mg two times a day   ibuprofen (ADVIL) 200 MG tablet Take 800 mg by mouth every 6 (six) hours as needed for headache or moderate pain. (Patient not taking: Reported on 11/04/2022)   No facility-administered medications prior to visit.    Review of Systems  Genitourinary:  Negative for decreased urine volume, difficulty urinating, dysuria, penile discharge, penile pain, penile swelling, scrotal swelling and testicular pain.  Skin:  Positive for color change and rash.    {Labs  Heme  Chem  Endocrine  Serology  Results Review (optional):23779}   Objective    BP 121/72   Pulse 76   Temp 98.4 F (36.9 C) (Oral)   Ht 5' 10"$  (1.778 m)   Wt 180 lb 8 oz (81.9 kg)   SpO2 98%   BMI 25.90 kg/m  {Show previous vital signs (optional):23777}  Physical Exam Vitals reviewed.  Constitutional:      General: He is awake.     Appearance: Normal appearance. He is well-developed and well-groomed.  HENT:  Head: Normocephalic and atraumatic.  Musculoskeletal:     Right hand: Normal range of motion. Decreased capillary refill. Normal pulse.     Left hand: Normal range of motion. Decreased capillary refill. Normal pulse.     Comments: Decreased capillary refill along both hands at multiple fingertips and knuckles (>3sec)  Hands and fingers are red on exam with some mild purple coloration along knuckles   Skin:    Capillary Refill: Capillary refill takes more than 3 seconds.  Neurological:     Mental Status: He is alert.  Psychiatric:        Behavior: Behavior is cooperative.       No results found for any visits on 11/04/22.  Assessment & Plan      No follow-ups on file.

## 2022-11-24 NOTE — Progress Notes (Signed)
Acute Office Visit   Patient: Gabriel Gibson   DOB: [birth year]   22 y.o. Male  MRN: [id number] Visit Date: 11/25/2022  Today's healthcare provider: Dani Gobble Chanler Mendonca, PA-C  Introduced myself to the patient as a Journalist, newspaper and provided education on APPs in clinical practice.    Chief Complaint  Patient presents with   Groin Problem    Pt states he is still having redness and dryness in his groin area, states the nystatin powder did not really help at all.    Subjective    HPI HPI     Groin Problem    Additional comments: Pt states he is still having redness and dryness in his groin area, states the nystatin powder did not really help at all.       Last edited by Georgina Peer, CMA on 11/25/2022  9:00 AM.       Redness and dry skin in groin area  Onset: gradual  Duration: ongoing for a few months now  Appearance: reports area is red and dry  Pain: none Itching: none  Interventions: tried Nystatin powder but this did not help with symptoms  Used Nystatin for about 2.5 weeks as directed without improvement   Medications: Outpatient Medications Prior to Visit  Medication Sig   gabapentin (NEURONTIN) 600 MG tablet Take  300 mg twice a day ( 1/2 tab) for one week then increase to 300 mg in the morning and 600 mg at night for one week, then increase 600 mg two times a day (Patient not taking: Reported on 11/25/2022)   [DISCONTINUED] ciprofloxacin-dexamethasone (CIPRODEX) OTIC suspension Place 4 drops into the right ear 2 (two) times daily.   [DISCONTINUED] ibuprofen (ADVIL) 200 MG tablet Take 800 mg by mouth every 6 (six) hours as needed for headache or moderate pain. (Patient not taking: Reported on 11/04/2022)   [DISCONTINUED] nystatin (MYCOSTATIN/NYSTOP) powder Apply 1 Application topically 3 (three) times daily.   No facility-administered medications prior to visit.    Review of Systems  Skin:  Positive for rash.       Objective    BP 115/73   Pulse 65   Temp  98.1 F (36.7 C) (Oral)   Wt 180 lb (81.6 kg)   SpO2 98%   BMI 25.83 kg/m    Physical Exam Vitals reviewed.  Constitutional:      General: He is awake.     Appearance: Normal appearance.  HENT:     Head: Normocephalic and atraumatic.  Eyes:     Extraocular Movements: Extraocular movements intact.     Conjunctiva/sclera: Conjunctivae normal.     Pupils: Pupils are equal, round, and reactive to light.  Pulmonary:     Effort: Pulmonary effort is normal.  Skin:    General: Skin is warm and dry.     Findings: Erythema and rash present.     Comments: Erythematous macular rash along groin and genitals   Neurological:     General: No focal deficit present.     Mental Status: He is alert and oriented to person, place, and time. Mental status is at baseline.  Psychiatric:        Mood and Affect: Mood normal.        Behavior: Behavior normal. Behavior is cooperative.        Thought Content: Thought content normal.        Judgment: Judgment normal.       No results  found for any visits on 11/25/22.  Assessment & Plan      No follow-ups on file.       Problem List Items Addressed This Visit   None Visit Diagnoses     Tinea of groin    -  Primary Acute on chronic, ongoing rash  Patient reports that rash has not improved with use of Nystatin powder since last apt Rash has been persistent- erythematous macular rash  At this time suspect tinea infection.  Will send in topical Clotrimazole ointment 1% to be applied BID for at least 2 weeks If not improving in 4 weeks, recommend he return to office for follow up.     Relevant Medications   Clotrimazole 1 % OINT        No follow-ups on file.   I, Brinlynn Gorton E Johngabriel Verde, PA-C, have reviewed all documentation for this visit. The documentation on 11/25/22 for the exam, diagnosis, procedures, and orders are all accurate and complete.   Talitha Givens, MHS, PA-C [facility]

## 2022-11-25 ENCOUNTER — Encounter: Payer: Self-pay | Admitting: Physician Assistant

## 2022-11-25 ENCOUNTER — Ambulatory Visit (INDEPENDENT_AMBULATORY_CARE_PROVIDER_SITE_OTHER): Payer: BC Managed Care – PPO | Admitting: Physician Assistant

## 2022-11-25 VITALS — BP 115/73 | HR 65 | Temp 98.1°F | Wt 180.0 lb

## 2022-11-25 DIAGNOSIS — B356 Tinea cruris: Secondary | ICD-10-CM

## 2022-11-25 MED ORDER — CLOTRIMAZOLE 1 % EX OINT: 1.0000 | TOPICAL_OINTMENT | Freq: Two times a day (BID) | CUTANEOUS | 1 refills | Status: DC

## 2022-11-29 ENCOUNTER — Encounter: Payer: Self-pay | Admitting: Physician Assistant

## 2022-12-05 ENCOUNTER — Other Ambulatory Visit (INDEPENDENT_AMBULATORY_CARE_PROVIDER_SITE_OTHER): Payer: Self-pay | Admitting: Vascular Surgery

## 2022-12-05 DIAGNOSIS — I999 Unspecified disorder of circulatory system: Secondary | ICD-10-CM

## 2022-12-09 ENCOUNTER — Encounter (INDEPENDENT_AMBULATORY_CARE_PROVIDER_SITE_OTHER): Payer: Self-pay | Admitting: Vascular Surgery

## 2022-12-09 ENCOUNTER — Encounter (INDEPENDENT_AMBULATORY_CARE_PROVIDER_SITE_OTHER): Payer: Self-pay

## 2022-12-30 ENCOUNTER — Ambulatory Visit: Payer: BC Managed Care – PPO | Admitting: Family Medicine

## 2022-12-30 ENCOUNTER — Encounter: Payer: Self-pay | Admitting: Family Medicine

## 2022-12-30 VITALS — BP 116/72 | HR 67 | Temp 98.6°F | Ht 70.0 in | Wt 178.2 lb

## 2022-12-30 DIAGNOSIS — J301 Allergic rhinitis due to pollen: Secondary | ICD-10-CM

## 2022-12-30 DIAGNOSIS — B356 Tinea cruris: Secondary | ICD-10-CM

## 2022-12-30 MED ORDER — CLOTRIMAZOLE-BETAMETHASONE 1-0.05 % EX CREA: 1.0000 | TOPICAL_CREAM | Freq: Every day | CUTANEOUS | 1 refills | Status: AC

## 2022-12-30 NOTE — Progress Notes (Signed)
BP 116/72   Pulse 67   Temp 98.6 F (37 C) (Oral)   Ht  (1.778 m)   Wt 178 lb 3.2 oz (80.8 kg)   SpO2 98%   BMI 25.57 kg/m    Subjective:    Patient ID: Gabriel Gibson, male    DOB: 05/17/2001, 22 y.o.   MRN: [id number]  HPI: Gabriel Gibson is a 22 y.o. male  Chief Complaint  Patient presents with   Rash    Patient says the area is about the same as the last time. Patient says the last time he was prescribed a cream and says the issues never went away fully and it is back.    RASH Duration:  weeks  Location: groin  Itching: yes Burning: yes Redness: yes Oozing: no Scaling: no Blisters: no Painful: no Fevers: no Change in detergents/soaps/personal care products: no Recent illness: no Recent travel:no History of same: yes Context: better Alleviating factors: clobetsol Treatments attempted:nystatin Shortness of breath: no  Throat/tongue swelling: no Myalgias/arthralgias: no  Has been having a plugged R ear and itchy eyes. Pollen has been bothering him  Relevant past medical, surgical, family and social history reviewed and updated as indicated. Interim medical history since our last visit reviewed. Allergies and medications reviewed and updated.  Review of Systems  Constitutional: Negative.   Respiratory: Negative.    Cardiovascular: Negative.   Gastrointestinal: Negative.   Musculoskeletal: Negative.   Skin:  Positive for rash. Negative for color change, pallor and wound.  Neurological: Negative.   Psychiatric/Behavioral: Negative.      Per HPI unless specifically indicated above     Objective:    BP 116/72   Pulse 67   Temp 98.6 F (37 C) (Oral)   Ht  (1.778 m)   Wt 178 lb 3.2 oz (80.8 kg)   SpO2 98%   BMI 25.57 kg/m   Wt Readings from Last 3 Encounters:  12/30/22 178 lb 3.2 oz (80.8 kg)  11/25/22 180 lb (81.6 kg)  11/04/22 180 lb 8 oz (81.9 kg)    Physical Exam Vitals and nursing note reviewed.  Constitutional:      General:  He is not in acute distress.    Appearance: Normal appearance. He is not ill-appearing, toxic-appearing or diaphoretic.  HENT:     Head: Normocephalic and atraumatic.     Right Ear: External ear normal.     Left Ear: External ear normal.     Nose: Nose normal.     Mouth/Throat:     Mouth: Mucous membranes are moist.     Pharynx: Oropharynx is clear.  Eyes:     General: No scleral icterus.       Right eye: No discharge.        Left eye: No discharge.     Extraocular Movements: Extraocular movements intact.     Conjunctiva/sclera: Conjunctivae normal.     Pupils: Pupils are equal, round, and reactive to light.  Cardiovascular:     Rate and Rhythm: Normal rate and regular rhythm.     Pulses: Normal pulses.     Heart sounds: Normal heart sounds. No murmur heard.    No friction rub. No gallop.  Pulmonary:     Effort: Pulmonary effort is normal. No respiratory distress.     Breath sounds: Normal breath sounds. No stridor. No wheezing, rhonchi or rales.  Chest:     Chest wall: No tenderness.  Musculoskeletal:  General: Normal range of motion.     Cervical back: Normal range of motion and neck supple.  Skin:    General: Skin is warm and dry.     Capillary Refill: Capillary refill takes less than 2 seconds.     Coloration: Skin is not jaundiced or pale.     Findings: Rash present. No bruising, erythema or lesion.  Neurological:     General: No focal deficit present.     Mental Status: He is alert and oriented to person, place, and time. Mental status is at baseline.  Psychiatric:        Mood and Affect: Mood normal.        Behavior: Behavior normal.        Thought Content: Thought content normal.        Judgment: Judgment normal.     Results for orders placed or performed in visit on 01/05/22  Vitamin D (25 hydroxy)  Result Value Ref Range   Vit D, 25-Hydroxy 24.3 (L) 30.0 - 100.0 ng/mL  B12  Result Value Ref Range   Vitamin B-12 1,290 (H) 232 - 1,245 pg/mL       Assessment & Plan:   Problem List Items Addressed This Visit   None Visit Diagnoses     Tinea of groin    -  Primary   Significantly better. No rash today- will send in lotrisone. OK to use if starts up again. Call with any concerns.   Relevant Medications   clotrimazole-betamethasone (LOTRISONE) cream   Seasonal allergic rhinitis due to pollen       Will start zyrtec. Call with any concerns.        Follow up plan: Return if symptoms worsen or fail to improve.

## 2023-01-05 DIAGNOSIS — N410 Acute prostatitis: Secondary | ICD-10-CM | POA: Diagnosis not present

## 2023-01-05 DIAGNOSIS — R350 Frequency of micturition: Secondary | ICD-10-CM | POA: Diagnosis not present

## 2023-01-13 ENCOUNTER — Ambulatory Visit (INDEPENDENT_AMBULATORY_CARE_PROVIDER_SITE_OTHER): Payer: BC Managed Care – PPO

## 2023-01-13 ENCOUNTER — Ambulatory Visit (INDEPENDENT_AMBULATORY_CARE_PROVIDER_SITE_OTHER): Payer: BC Managed Care – PPO | Admitting: Nurse Practitioner

## 2023-01-13 ENCOUNTER — Encounter (INDEPENDENT_AMBULATORY_CARE_PROVIDER_SITE_OTHER): Payer: Self-pay | Admitting: Nurse Practitioner

## 2023-01-13 VITALS — BP 128/81 | HR 67 | Resp 18 | Ht 71.0 in | Wt 176.4 lb

## 2023-01-13 DIAGNOSIS — L819 Disorder of pigmentation, unspecified: Secondary | ICD-10-CM

## 2023-01-13 DIAGNOSIS — I7300 Raynaud's syndrome without gangrene: Secondary | ICD-10-CM | POA: Diagnosis not present

## 2023-01-13 DIAGNOSIS — I999 Unspecified disorder of circulatory system: Secondary | ICD-10-CM | POA: Diagnosis not present

## 2023-01-16 LAB — VAS US ABI WITH/WO TBI
Left ABI: 1.03
Right ABI: 1.06

## 2023-01-30 ENCOUNTER — Encounter (INDEPENDENT_AMBULATORY_CARE_PROVIDER_SITE_OTHER): Payer: Self-pay | Admitting: Nurse Practitioner

## 2023-01-30 NOTE — Progress Notes (Signed)
Subjective:    Patient ID: Gabriel Gibson, male    DOB: 03-Jul-2001, 22 y.o.   MRN: [id number] Chief Complaint  Patient presents with   New Patient (Initial Visit)    np abi +consult; Decreased circulation    The patient is a 22 year old male who presents today for evaluation of possible circulatory issues.  The patient notes that he has he has a stable right majority of the time he turned lighter sometimes.  Notes that these hands will also turn purple or blue sometimes.  This also happens in his toes.  It seems to be worse in the winter and cooler months.  He has a history of restless leg syndrome.  Today noninvasive studies show an ABI of 1.06 on the right and 1.03 on the left.  The patient has normal TBI's bilaterally he has strong triphasic tibial artery waveforms bilaterally with normal toe waveforms bilaterally.    Review of Systems  Skin:  Positive for color change.  All other systems reviewed and are negative.      Objective:   Physical Exam Vitals reviewed.  HENT:     Head: Normocephalic.  Cardiovascular:     Rate and Rhythm: Normal rate.     Pulses: Normal pulses.  Pulmonary:     Effort: Pulmonary effort is normal.  Skin:    General: Skin is warm and dry.  Neurological:     Mental Status: He is alert and oriented to person, place, and time.  Psychiatric:        Mood and Affect: Mood normal.        Behavior: Behavior normal.        Thought Content: Thought content normal.        Judgment: Judgment normal.     BP 128/81 (BP Location: Left Arm)   Pulse 67   Resp 18   Ht 5\' 11"  (1.803 m)   Wt 176 lb 6.4 oz (80 kg)   BMI 24.60 kg/m   Past Medical History:  Diagnosis Date   Asthma     Social History   Socioeconomic History   Marital status: Single    Spouse name: Not on file   Number of children: Not on file   Years of education: Not on file   Highest education level: Not on file  Occupational History   Not on file  Tobacco Use   Smoking status:  Every Day    Types: E-cigarettes   Smokeless tobacco: Never  Vaping Use   Vaping Use: Every day   Substances: Nicotine, Flavoring  Substance and Sexual Activity   Alcohol use: Yes    Comment: occassional   Drug use: Never   Sexual activity: Not Currently  Other Topics Concern   Not on file  Social History Narrative   Not on file   Social Determinants of Health   Financial Resource Strain: Not on file  Food Insecurity: Not on file  Transportation Needs: Not on file  Physical Activity: Not on file  Stress: Not on file  Social Connections: Not on file  Intimate Partner Violence: Not on file    History reviewed. No pertinent surgical history.  Family History  Problem Relation Age of Onset   Other Mother        unknown medical history   Healthy Father     No Known Allergies     Latest Ref Rng & Units 10/15/2021   10:37 AM 08/11/2021    7:19 PM  CBC  WBC 3.4 - 10.8 x10E3/uL 5.2  7.8   Hemoglobin 13.0 - 17.7 g/dL 74.2  59.5   Hematocrit 37.5 - 51.0 % 48.9  50.6   Platelets 150 - 450 x10E3/uL 270  296       CMP     Component Value Date/Time   NA 141 10/15/2021 1037   K 4.1 10/15/2021 1037   CL 100 10/15/2021 1037   CO2 29 10/15/2021 1037   GLUCOSE 83 10/15/2021 1037   GLUCOSE 104 (H) 08/11/2021 1919   BUN 8 10/15/2021 1037   CREATININE 1.10 10/15/2021 1037   CALCIUM 9.8 10/15/2021 1037   PROT 6.9 10/15/2021 1037   ALBUMIN 4.9 10/15/2021 1037   AST 18 10/15/2021 1037   ALT 22 10/15/2021 1037   ALKPHOS 51 10/15/2021 1037   BILITOT 0.8 10/15/2021 1037   GFRNONAA >60 08/11/2021 1919     VAS [place] ABI WITH/WO TBI  Result Date: 01/16/2023  LOWER EXTREMITY DOPPLER STUDY Patient Name:  Gabriel Gibson  Date of Exam:   01/13/2023 Medical Rec #: [id number]     Accession #:    [id number] Date of Birth: 2000-11-19     Patient Gender: M Patient Age:   89 years Exam Location:  [facility] Procedure:      VAS [place] ABI WITH/WO TBI Referring Phys: Barbara Cower DEW  --------------------------------------------------------------------------------  Indications: Claudication, and peripheral artery disease. High Risk Factors: Current smoker. Other Factors: Discoloration of upper and lower extremity digits.  Performing Technologist: Hardie Lora RVT  Examination Guidelines: A complete evaluation includes at minimum, Doppler waveform signals and systolic blood pressure reading at the level of bilateral brachial, anterior tibial, and posterior tibial arteries, when vessel segments are accessible. Bilateral testing is considered an integral part of a complete examination. Photoelectric Plethysmograph (PPG) waveforms and toe systolic pressure readings are included as required and additional duplex testing as needed. Limited examinations for reoccurring indications may be performed as noted.  ABI Findings: +---------+------------------+-----+---------+--------+ Right    Rt Pressure (mmHg)IndexWaveform Comment  +---------+------------------+-----+---------+--------+ Brachial 144                                      +---------+------------------+-----+---------+--------+ PTA      152               1.06 triphasic         +---------+------------------+-----+---------+--------+ DP       146               1.01 triphasic         +---------+------------------+-----+---------+--------+ Great Toe124               0.86                   +---------+------------------+-----+---------+--------+ +---------+------------------+-----+---------+-------+ Left     Lt Pressure (mmHg)IndexWaveform Comment +---------+------------------+-----+---------+-------+ Brachial 144                                     +---------+------------------+-----+---------+-------+ PTA      148               1.03 triphasic        +---------+------------------+-----+---------+-------+ DP       131               0.91 triphasic         +---------+------------------+-----+---------+-------+  Great Toe133               0.92                  +---------+------------------+-----+---------+-------+ +-------+-----------+-----------+------------+------------+ ABI/TBIToday's ABIToday's TBIPrevious ABIPrevious TBI +-------+-----------+-----------+------------+------------+ Right  1.06       0.86                                +-------+-----------+-----------+------------+------------+ Left   1.03       0.92                                +-------+-----------+-----------+------------+------------+   Summary: Right: Resting right ankle-brachial index is within normal range. The right toe-brachial index is normal. Left: Resting left ankle-brachial index is within normal range. The left toe-brachial index is normal. *See table(s) above for measurements and observations.  Electronically signed by Festus Barren MD on 01/16/2023 at 8:31:46 AM.    Final        Assessment & Plan:   1. Discoloration of skin of multiple sites of lower extremity Based on the patient's lower extremity studies he has adequate perfusion.  I suspect this is Raynaud's disease as noted below.  2. Raynaud's disease without gangrene Based upon the patient's description of symptoms I suspect that he has Raynaud's disease.  He is describing the typical discoloration seen with Raynaud's disease as well as the fact that people typically are more affected in the cold and early spring time.  This is currently not painful and he has no open ulcerations.  Because of this we will not prescribe any medication for treatment.  I discussed Raynaud's disease and conservative therapies such as keeping hands and feet warm during winter months.  We discussed signs and symptoms of ischemia and that are more concerning such as permanent discoloration or associated pain.  Continued smoking also worsens vasoconstriction spasms.  Will have the patient follow-up with [place] on an as-needed  basis.  Current Outpatient Medications on File Prior to Visit  Medication Sig Dispense Refill   levofloxacin (LEVAQUIN) 500 MG tablet Take 500 mg by mouth daily.     tamsulosin (FLOMAX) 0.4 MG CAPS capsule Take 0.4 mg by mouth daily.     clotrimazole-betamethasone (LOTRISONE) cream Apply 1 Application topically daily. (Patient not taking: Reported on 01/13/2023) 30 g 1   No current facility-administered medications on file prior to visit.    There are no Patient Instructions on file for this visit. No follow-ups on file.   Georgiana Spinner, NP

## 2023-02-17 ENCOUNTER — Ambulatory Visit: Payer: BC Managed Care – PPO | Admitting: Urology

## 2023-03-13 ENCOUNTER — Ambulatory Visit: Payer: BC Managed Care – PPO | Admitting: Urology

## 2023-03-13 ENCOUNTER — Encounter: Payer: Self-pay | Admitting: Urology

## 2023-03-17 ENCOUNTER — Ambulatory Visit: Payer: BC Managed Care – PPO | Admitting: Urology

## 2023-05-05 ENCOUNTER — Encounter: Payer: Self-pay | Admitting: Physician Assistant

## 2023-05-05 ENCOUNTER — Ambulatory Visit: Payer: BC Managed Care – PPO | Admitting: Physician Assistant

## 2023-05-05 VITALS — BP 118/74 | HR 80 | Ht 71.0 in | Wt 177.4 lb

## 2023-05-05 DIAGNOSIS — N3943 Post-void dribbling: Secondary | ICD-10-CM | POA: Diagnosis not present

## 2023-05-05 DIAGNOSIS — R3915 Urgency of urination: Secondary | ICD-10-CM

## 2023-05-05 LAB — URINALYSIS, ROUTINE W REFLEX MICROSCOPIC
Bilirubin, UA: NEGATIVE
Glucose, UA: NEGATIVE
Ketones, UA: NEGATIVE
Leukocytes,UA: NEGATIVE
Nitrite, UA: NEGATIVE
Protein,UA: NEGATIVE
RBC, UA: NEGATIVE
Specific Gravity, UA: 1.02 (ref 1.005–1.030)
Urobilinogen, Ur: 2 mg/dL — ABNORMAL HIGH (ref 0.2–1.0)
pH, UA: 8.5 — ABNORMAL HIGH (ref 5.0–7.5)

## 2023-05-05 NOTE — Progress Notes (Signed)
Acute Office Visit   Patient: Gabriel Gibson   DOB: Oct 21, 2000   22 y.o. Male  MRN: [id number] Visit Date: 05/05/2023  Today's healthcare provider: Oswaldo Conroy Bruno Leach, PA-C  Introduced myself to the patient as a Secondary school teacher and provided education on APPs in clinical practice.    Chief Complaint  Patient presents with   Urinary Urgency   Urinary Incontinence    Patient says he notices a small amount of urine, he says a few drops that leaks when he swats down. Patient declines any discomfort when he finally urinates. Patient declines trying any over the counter medications. Patient says it has been ongoing issue and this is the first time being seen.   Subjective    HPI HPI     Urinary Incontinence    Additional comments: Patient says he notices a small amount of urine, he says a few drops that leaks when he swats down. Patient declines any discomfort when he finally urinates. Patient declines trying any over the counter medications. Patient says it has been ongoing issue and this is the first time being seen.      Last edited by Malen Gauze, CMA on 05/05/2023  3:24 PM.       Urinary concern  Onset: gradual  Duration: for a few months  Associated symptoms: He denies urinary incontinence, dysuria, increased urinary frequency, urinary urgency. He denies pushing or straining to start urinating  He feels like he has to urinate all the time but does not actually need to  He denies rectal pain and does not engage in anal sex. He denies constipation or abdominal pain   He does report pain in flank area but says this has been present for almost 10 years and is not new or fluctuating with current symptoms  Interventions: none   He reports when he bends over or squats he notices a few drops of urine will come out - usually happens after he has just gone to the restroom  He drinks about 2-3 caffienated beverages per day   Medications: Outpatient Medications Prior to Visit  Medication  Sig   clotrimazole-betamethasone (LOTRISONE) cream Apply 1 Application topically daily. (Patient not taking: Reported on 01/13/2023)   levofloxacin (LEVAQUIN) 500 MG tablet Take 500 mg by mouth daily. (Patient not taking: Reported on 05/05/2023)   tamsulosin (FLOMAX) 0.4 MG CAPS capsule Take 0.4 mg by mouth daily. (Patient not taking: Reported on 05/05/2023)   No facility-administered medications prior to visit.    Review of Systems  Constitutional:  Negative for chills, fatigue and fever.  Gastrointestinal:  Negative for constipation, diarrhea, nausea, rectal pain and vomiting.  Genitourinary:  Positive for urgency. Negative for dysuria, frequency, penile discharge, penile pain, penile swelling, scrotal swelling and testicular pain.        Objective    BP 118/74   Pulse 80   Ht 5\' 11"  (1.803 m)   Wt 177 lb 6.4 oz (80.5 kg)   SpO2 99%   BMI 24.74 kg/m     Physical Exam Vitals reviewed.  Constitutional:      General: He is awake.     Appearance: Normal appearance. He is well-developed and well-groomed.  HENT:     Head: Normocephalic and atraumatic.  Pulmonary:     Effort: Pulmonary effort is normal.  Musculoskeletal:     Cervical back: Normal range of motion.  Neurological:     Mental Status: He is alert.  Psychiatric:  Attention and Perception: Attention and perception normal.        Mood and Affect: Mood and affect normal.        Speech: Speech normal.        Behavior: Behavior normal. Behavior is cooperative.       Results for orders placed or performed in visit on 05/05/23  Urinalysis, Routine w reflex microscopic  Result Value Ref Range   Specific Gravity, UA 1.020 1.005 - 1.030   pH, UA 8.5 (H) 5.0 - 7.5   Color, UA Yellow Yellow   Appearance Ur Clear Clear   Leukocytes,UA Negative Negative   Protein,UA Negative Negative/Trace   Glucose, UA Negative Negative   Ketones, UA Negative Negative   RBC, UA Negative Negative   Bilirubin, UA Negative  Negative   Urobilinogen, Ur 2.0 (H) 0.2 - 1.0 mg/dL   Nitrite, UA Negative Negative   Microscopic Examination Comment     Assessment & Plan      No follow-ups on file.      Problem List Items Addressed This Visit   None Visit Diagnoses     Urinary incontinence, post-void dribbling    -  Primary Chronic, per patient, ongoing He reports that for the past few months he has had post-void dribbling when bending or squatting UA was negative today in office- results reviewed with patient during apt Will send referral to Urology for evaluation  Follow up after Urology apt to coordinate care    Relevant Orders   Ambulatory referral to Urology   Urinary urgency       Relevant Orders   Urinalysis, Routine w reflex microscopic (Completed)   Ambulatory referral to Urology        No follow-ups on file.   I, Deakin Lacek E Betzayda Braxton, PA-C, have reviewed all documentation for this visit. The documentation on 05/05/23 for the exam, diagnosis, procedures, and orders are all accurate and complete.   Jacquelin Hawking, MHS, PA-C [facility]

## 2023-05-19 ENCOUNTER — Ambulatory Visit: Payer: BC Managed Care – PPO | Admitting: Physician Assistant

## 2023-05-26 ENCOUNTER — Ambulatory Visit (INDEPENDENT_AMBULATORY_CARE_PROVIDER_SITE_OTHER): Payer: BC Managed Care – PPO | Admitting: Physician Assistant

## 2023-05-26 ENCOUNTER — Encounter: Payer: Self-pay | Admitting: Physician Assistant

## 2023-05-26 VITALS — BP 121/82 | HR 66 | Wt 177.9 lb

## 2023-05-26 DIAGNOSIS — R3915 Urgency of urination: Secondary | ICD-10-CM | POA: Diagnosis not present

## 2023-05-26 DIAGNOSIS — N393 Stress incontinence (female) (male): Secondary | ICD-10-CM

## 2023-05-26 LAB — BLADDER SCAN AMB NON-IMAGING

## 2023-05-26 LAB — URINALYSIS, COMPLETE
Bilirubin, UA: NEGATIVE
Glucose, UA: NEGATIVE
Ketones, UA: NEGATIVE
Leukocytes,UA: NEGATIVE
Nitrite, UA: NEGATIVE
Protein,UA: NEGATIVE
RBC, UA: NEGATIVE
Specific Gravity, UA: 1.02 (ref 1.005–1.030)
Urobilinogen, Ur: 0.2 mg/dL (ref 0.2–1.0)
pH, UA: 7.5 (ref 5.0–7.5)

## 2023-05-26 LAB — MICROSCOPIC EXAMINATION

## 2023-05-26 NOTE — Progress Notes (Signed)
05/26/2023 10:27 AM   Gabriel Gibson Gabriel Gibson 06-25-2001 [id number]  CC: Chief Complaint  Patient presents with   Urinary Urgency   HPI: Gabriel Gibson is a 22 y.o. male previously seen by Dr. Lonna Cobb 3 years ago with a 4-week history of dysuria and scrotal pain with negative urine testing who presents today for evaluation of urinary urgency.   Today he reports chronic dribbling urination when he squats or bends down.  This has been ongoing for about a year.  He denies dysuria, pelvic pain, penile discharge, or testicular swelling.  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  He denies saddle anesthesia or fecal incontinence.  In-office UA and microscopy today pan negative. PVR 0mL.  PMH: Past Medical History:  Diagnosis Date   Asthma     Surgical History: No past surgical history on file.  Home Medications:  Allergies as of 05/26/2023   No Known Allergies      Medication List        Accurate as of May 26, 2023 10:27 AM. If you have any questions, ask your nurse or doctor.          clotrimazole-betamethasone cream Commonly known as: LOTRISONE Apply 1 Application topically daily.   levofloxacin 500 MG tablet Commonly known as: LEVAQUIN Take 500 mg by mouth daily.   tamsulosin 0.4 MG Caps capsule Commonly known as: FLOMAX Take 0.4 mg by mouth daily.        Allergies:  No Known Allergies  Family History: Family History  Problem Relation Age of Onset   Other Mother        unknown medical history   Healthy Father     Social History:   reports that he has been smoking e-cigarettes. He has never used smokeless tobacco. He reports current alcohol use. He reports that he does not use drugs.  Physical Exam: BP 121/82   Pulse 66   Wt 177 lb 14.4 oz (80.7 kg)   BMI 24.81 kg/m   Constitutional:  Alert and oriented, no acute distress, nontoxic  appearing HEENT: [place], AT Cardiovascular: No clubbing, cyanosis, or edema Respiratory: Normal respiratory effort, no increased work of breathing Skin: No rashes, bruises or suspicious lesions Neurologic: Grossly intact, no focal deficits, moving all 4 extremities Psychiatric: Normal mood and affect  Laboratory Data: Results for orders placed or performed in visit on 05/26/23  Microscopic Examination   Urine  Result Value Ref Range   WBC, UA 0-5 0 - 5 /hpf   RBC, Urine 0-2 0 - 2 /hpf   Epithelial Cells (non renal) 0-10 0 - 10 /hpf   Crystals Present (A) N/A   Crystal Type Amorphous Sediment N/A   Mucus, UA Present (A) Not Estab.   Bacteria, UA Few None seen/Few  Urinalysis, Complete  Result Value Ref Range   Specific Gravity, UA 1.020 1.005 - 1.030   pH, UA 7.5 5.0 - 7.5   Color, UA Yellow Yellow   Appearance Ur Clear Clear   Leukocytes,UA Negative Negative   Protein,UA Negative Negative/Trace   Glucose, UA Negative Negative   Ketones, UA Negative Negative   RBC, UA Negative Negative   Bilirubin, UA Negative Negative   Urobilinogen, Ur 0.2 0.2 - 1.0 mg/dL   Nitrite, UA Negative Negative   Microscopic Examination See below:   Bladder Scan (Post Void Residual) in office  Result Value Ref Range   Scan  Result    Assessment & Plan:   1. Stress incontinence of urine Chronic stress incontinence without other voiding symptoms or pain.  UA is bland, PVR is WNL.  Will send for gonorrhea and Chlamydia testing, though suspicion is low for this.  I recommended following up with pelvic floor PT due to suspicion for pelvic floor dysfunction.  He is in agreement with this plan. - Bladder Scan (Post Void Residual) in office - Urinalysis, Complete - Ct Ng M genitalium NAA, Urine - Ambulatory referral to Physical Therapy   Return if symptoms worsen or fail to improve.  Carman Ching, PA-C  [facility] [place] [address] [place], [place]  [address] [phone]

## 2023-05-26 NOTE — Patient Instructions (Signed)
To schedule pelvic floor physical therapy: [phone]

## 2023-05-28 LAB — CT NG M GENITALIUM NAA, URINE
Chlamydia trachomatis, NAA: NEGATIVE
Mycoplasma genitalium NAA: NEGATIVE
Neisseria gonorrhoeae, NAA: NEGATIVE

## 2024-02-27 ENCOUNTER — Ambulatory Visit: Payer: Self-pay | Admitting: Nurse Practitioner

## 2024-03-02 NOTE — Patient Instructions (Incomplete)
 Abdominal Pain, Adult    Many things can cause belly (abdominal) pain. In most cases, belly pain is not a serious problem and can be watched and treated at home. But in some cases, it can be serious.  Your doctor will try to find the cause of your belly pain.  Follow these instructions at home:  Medicines  Take over-the-counter and prescription medicines only as told by your doctor.  Do not take medicines that help you poop (laxatives) unless told by your doctor.  General instructions  Watch your belly pain for any changes. Tell your doctor if the pain gets worse.  Drink enough fluid to keep your pee (urine) pale yellow.  Contact a doctor if:  Your belly pain changes or gets worse.  You have very bad cramping or bloating in your belly.  You vomit.  Your pain gets worse with meals, after eating, or with certain foods.  You have trouble pooping or have watery poop for more than 2-3 days.  You are not hungry, or you lose weight without trying.  You have signs of not getting enough fluid or water (dehydration). These may include:  Dark pee, very Chastain pee, or no pee.  Cracked lips or dry mouth.  Feeling sleepy or weak.  You have pain when you pee or poop.  Your belly pain wakes you up at night.  You have blood in your pee.  You have a fever.  Get help right away if:  You cannot stop vomiting.  Your pain is only in one part of your belly, like on the right side.  You have bloody or black poop, or poop that looks like tar.  You have trouble breathing.  You have chest pain.  These symptoms may be an emergency. Get help right away. Call 911.  Do not wait to see if the symptoms will go away.  Do not drive yourself to the hospital.  This information is not intended to replace advice given to you by your health care provider. Make sure you discuss any questions you have with your health care provider.  Document Revised: 06/22/2022 Document Reviewed: 06/22/2022  Elsevier Patient Education  2024 [employer].

## 2024-03-08 ENCOUNTER — Ambulatory Visit: Admitting: Nurse Practitioner
